# Patient Record
Sex: Female | Born: 1945 | Race: Black or African American | Hispanic: No | Marital: Single | Smoking: Smoker, current status unknown
Health system: Southern US, Community
[De-identification: ages and names within clinical notes are randomized; demographics above are authoritative.]

## PROBLEM LIST (undated history)

## (undated) DIAGNOSIS — E119 Type 2 diabetes mellitus without complications: Secondary | ICD-10-CM

## (undated) DIAGNOSIS — I10 Essential (primary) hypertension: Secondary | ICD-10-CM

## (undated) DIAGNOSIS — F0390 Unspecified dementia without behavioral disturbance: Secondary | ICD-10-CM

---

## 2021-02-07 ENCOUNTER — Encounter (HOSPITAL_COMMUNITY): Payer: Self-pay | Admitting: Pharmacy Technician

## 2021-02-07 ENCOUNTER — Emergency Department (HOSPITAL_COMMUNITY): Payer: Medicare Other

## 2021-02-07 ENCOUNTER — Inpatient Hospital Stay (HOSPITAL_COMMUNITY): Payer: Medicare Other

## 2021-02-07 ENCOUNTER — Inpatient Hospital Stay (HOSPITAL_COMMUNITY)
Admission: EM | Admit: 2021-02-07 | Discharge: 2021-03-17 | DRG: 296 | Disposition: E | Payer: Medicare Other | Attending: Pulmonary Disease | Admitting: Pulmonary Disease

## 2021-02-07 DIAGNOSIS — Z7401 Bed confinement status: Secondary | ICD-10-CM

## 2021-02-07 DIAGNOSIS — S065X9A Traumatic subdural hemorrhage with loss of consciousness of unspecified duration, initial encounter: Secondary | ICD-10-CM

## 2021-02-07 DIAGNOSIS — E876 Hypokalemia: Secondary | ICD-10-CM | POA: Diagnosis present

## 2021-02-07 DIAGNOSIS — G931 Anoxic brain damage, not elsewhere classified: Secondary | ICD-10-CM | POA: Diagnosis present

## 2021-02-07 DIAGNOSIS — E7220 Disorder of urea cycle metabolism, unspecified: Secondary | ICD-10-CM | POA: Diagnosis not present

## 2021-02-07 DIAGNOSIS — Z85118 Personal history of other malignant neoplasm of bronchus and lung: Secondary | ICD-10-CM

## 2021-02-07 DIAGNOSIS — D75839 Thrombocytosis, unspecified: Secondary | ICD-10-CM | POA: Diagnosis not present

## 2021-02-07 DIAGNOSIS — Z20822 Contact with and (suspected) exposure to covid-19: Secondary | ICD-10-CM | POA: Diagnosis present

## 2021-02-07 DIAGNOSIS — I469 Cardiac arrest, cause unspecified: Secondary | ICD-10-CM | POA: Diagnosis present

## 2021-02-07 DIAGNOSIS — E871 Hypo-osmolality and hyponatremia: Secondary | ICD-10-CM | POA: Diagnosis present

## 2021-02-07 DIAGNOSIS — I10 Essential (primary) hypertension: Secondary | ICD-10-CM | POA: Diagnosis present

## 2021-02-07 DIAGNOSIS — E870 Hyperosmolality and hypernatremia: Secondary | ICD-10-CM | POA: Diagnosis not present

## 2021-02-07 DIAGNOSIS — R6520 Severe sepsis without septic shock: Secondary | ICD-10-CM | POA: Diagnosis present

## 2021-02-07 DIAGNOSIS — J9601 Acute respiratory failure with hypoxia: Secondary | ICD-10-CM | POA: Diagnosis present

## 2021-02-07 DIAGNOSIS — A419 Sepsis, unspecified organism: Secondary | ICD-10-CM | POA: Diagnosis present

## 2021-02-07 DIAGNOSIS — E43 Unspecified severe protein-calorie malnutrition: Secondary | ICD-10-CM | POA: Diagnosis present

## 2021-02-07 DIAGNOSIS — G40001 Localization-related (focal) (partial) idiopathic epilepsy and epileptic syndromes with seizures of localized onset, not intractable, with status epilepticus: Secondary | ICD-10-CM | POA: Diagnosis present

## 2021-02-07 DIAGNOSIS — E11649 Type 2 diabetes mellitus with hypoglycemia without coma: Secondary | ICD-10-CM | POA: Diagnosis not present

## 2021-02-07 DIAGNOSIS — J189 Pneumonia, unspecified organism: Secondary | ICD-10-CM

## 2021-02-07 DIAGNOSIS — G40901 Epilepsy, unspecified, not intractable, with status epilepticus: Secondary | ICD-10-CM | POA: Diagnosis not present

## 2021-02-07 DIAGNOSIS — R34 Anuria and oliguria: Secondary | ICD-10-CM | POA: Diagnosis not present

## 2021-02-07 DIAGNOSIS — F0390 Unspecified dementia without behavioral disturbance: Secondary | ICD-10-CM | POA: Diagnosis present

## 2021-02-07 DIAGNOSIS — D509 Iron deficiency anemia, unspecified: Secondary | ICD-10-CM | POA: Diagnosis present

## 2021-02-07 DIAGNOSIS — L98429 Non-pressure chronic ulcer of back with unspecified severity: Secondary | ICD-10-CM | POA: Diagnosis not present

## 2021-02-07 DIAGNOSIS — R64 Cachexia: Secondary | ICD-10-CM | POA: Diagnosis present

## 2021-02-07 DIAGNOSIS — L89892 Pressure ulcer of other site, stage 2: Secondary | ICD-10-CM | POA: Diagnosis present

## 2021-02-07 DIAGNOSIS — R0902 Hypoxemia: Secondary | ICD-10-CM

## 2021-02-07 DIAGNOSIS — Z66 Do not resuscitate: Secondary | ICD-10-CM | POA: Diagnosis present

## 2021-02-07 DIAGNOSIS — R918 Other nonspecific abnormal finding of lung field: Secondary | ICD-10-CM | POA: Diagnosis present

## 2021-02-07 DIAGNOSIS — L8990 Pressure ulcer of unspecified site, unspecified stage: Secondary | ICD-10-CM | POA: Insufficient documentation

## 2021-02-07 DIAGNOSIS — R627 Adult failure to thrive: Secondary | ICD-10-CM | POA: Diagnosis present

## 2021-02-07 DIAGNOSIS — R579 Shock, unspecified: Secondary | ICD-10-CM | POA: Diagnosis not present

## 2021-02-07 DIAGNOSIS — Z978 Presence of other specified devices: Secondary | ICD-10-CM | POA: Diagnosis not present

## 2021-02-07 DIAGNOSIS — I6203 Nontraumatic chronic subdural hemorrhage: Secondary | ICD-10-CM | POA: Diagnosis present

## 2021-02-07 DIAGNOSIS — S065XAA Traumatic subdural hemorrhage with loss of consciousness status unknown, initial encounter: Secondary | ICD-10-CM

## 2021-02-07 DIAGNOSIS — D6489 Other specified anemias: Secondary | ICD-10-CM | POA: Diagnosis present

## 2021-02-07 DIAGNOSIS — I6202 Nontraumatic subacute subdural hemorrhage: Secondary | ICD-10-CM | POA: Diagnosis present

## 2021-02-07 DIAGNOSIS — N179 Acute kidney failure, unspecified: Secondary | ICD-10-CM | POA: Diagnosis present

## 2021-02-07 DIAGNOSIS — L89154 Pressure ulcer of sacral region, stage 4: Secondary | ICD-10-CM | POA: Diagnosis present

## 2021-02-07 DIAGNOSIS — Z681 Body mass index (BMI) 19 or less, adult: Secondary | ICD-10-CM

## 2021-02-07 DIAGNOSIS — I959 Hypotension, unspecified: Secondary | ICD-10-CM

## 2021-02-07 DIAGNOSIS — R001 Bradycardia, unspecified: Secondary | ICD-10-CM | POA: Diagnosis not present

## 2021-02-07 DIAGNOSIS — G40409 Other generalized epilepsy and epileptic syndromes, not intractable, without status epilepticus: Secondary | ICD-10-CM | POA: Diagnosis not present

## 2021-02-07 DIAGNOSIS — Z515 Encounter for palliative care: Secondary | ICD-10-CM | POA: Diagnosis not present

## 2021-02-07 DIAGNOSIS — Z902 Acquired absence of lung [part of]: Secondary | ICD-10-CM

## 2021-02-07 HISTORY — DX: Type 2 diabetes mellitus without complications: E11.9

## 2021-02-07 HISTORY — DX: Unspecified dementia, unspecified severity, without behavioral disturbance, psychotic disturbance, mood disturbance, and anxiety: F03.90

## 2021-02-07 HISTORY — DX: Essential (primary) hypertension: I10

## 2021-02-07 LAB — I-STAT CHEM 8, ED
BUN: 6 mg/dL — ABNORMAL LOW (ref 8–23)
Calcium, Ion: 1.11 mmol/L — ABNORMAL LOW (ref 1.15–1.40)
Chloride: 96 mmol/L — ABNORMAL LOW (ref 98–111)
Creatinine, Ser: 0.8 mg/dL (ref 0.44–1.00)
Glucose, Bld: 185 mg/dL — ABNORMAL HIGH (ref 70–99)
HCT: 25 % — ABNORMAL LOW (ref 36.0–46.0)
Hemoglobin: 8.5 g/dL — ABNORMAL LOW (ref 12.0–15.0)
Potassium: 4.3 mmol/L (ref 3.5–5.1)
Sodium: 130 mmol/L — ABNORMAL LOW (ref 135–145)
TCO2: 22 mmol/L (ref 22–32)

## 2021-02-07 LAB — BASIC METABOLIC PANEL
Anion gap: 15 (ref 5–15)
Anion gap: 6 (ref 5–15)
BUN: 6 mg/dL — ABNORMAL LOW (ref 8–23)
BUN: 8 mg/dL (ref 8–23)
CO2: 17 mmol/L — ABNORMAL LOW (ref 22–32)
CO2: 22 mmol/L (ref 22–32)
Calcium: 7.8 mg/dL — ABNORMAL LOW (ref 8.9–10.3)
Calcium: 6.8 mg/dL — ABNORMAL LOW (ref 8.9–10.3)
Chloride: 98 mmol/L (ref 98–111)
Chloride: 101 mmol/L (ref 98–111)
Creatinine, Ser: 1.15 mg/dL — ABNORMAL HIGH (ref 0.44–1.00)
Creatinine, Ser: 0.89 mg/dL (ref 0.44–1.00)
GFR, Estimated: 50 mL/min — ABNORMAL LOW (ref >60)
GFR, Estimated: >60 mL/min (ref >60)
Glucose, Bld: 183 mg/dL — ABNORMAL HIGH (ref 70–99)
Glucose, Bld: 239 mg/dL — ABNORMAL HIGH (ref 70–99)
Potassium: 4.4 mmol/L (ref 3.5–5.1)
Potassium: 4 mmol/L (ref 3.5–5.1)
Sodium: 130 mmol/L — ABNORMAL LOW (ref 135–145)
Sodium: 129 mmol/L — ABNORMAL LOW (ref 135–145)

## 2021-02-07 LAB — CBC
HCT: 28.9 % — ABNORMAL LOW (ref 36.0–46.0)
Hemoglobin: 8 g/dL — ABNORMAL LOW (ref 12.0–15.0)
MCH: 23 pg — ABNORMAL LOW (ref 26.0–34.0)
MCHC: 27.7 g/dL — ABNORMAL LOW (ref 30.0–36.0)
MCV: 83 fL (ref 80.0–100.0)
Platelets: 515 10*3/uL — ABNORMAL HIGH (ref 150–400)
RBC: 3.48 MIL/uL — ABNORMAL LOW (ref 3.87–5.11)
RDW: 18.7 % — ABNORMAL HIGH (ref 11.5–15.5)
WBC: 15.7 10*3/uL — ABNORMAL HIGH (ref 4.0–10.5)
nRBC: 0 % (ref 0.0–0.2)

## 2021-02-07 LAB — RESP PANEL BY RT-PCR (FLU A&B, COVID) ARPGX2
Influenza A by PCR: NEGATIVE
Influenza B by PCR: NEGATIVE
SARS Coronavirus 2 by RT PCR: NEGATIVE

## 2021-02-07 LAB — I-STAT ARTERIAL BLOOD GAS, ED
Acid-Base Excess: 1 mmol/L (ref 0.0–2.0)
Bicarbonate: 27.4 mmol/L (ref 20.0–28.0)
Calcium, Ion: 1.11 mmol/L — ABNORMAL LOW (ref 1.15–1.40)
HCT: 26 % — ABNORMAL LOW (ref 36.0–46.0)
Hemoglobin: 8.8 g/dL — ABNORMAL LOW (ref 12.0–15.0)
O2 Saturation: 100 %
Potassium: 3.6 mmol/L (ref 3.5–5.1)
Sodium: 132 mmol/L — ABNORMAL LOW (ref 135–145)
TCO2: 29 mmol/L (ref 22–32)
pCO2 arterial: 54.8 mmHg — ABNORMAL HIGH (ref 32.0–48.0)
pH, Arterial: 7.307 — ABNORMAL LOW (ref 7.350–7.450)
pO2, Arterial: 546 mmHg — ABNORMAL HIGH (ref 83.0–108.0)

## 2021-02-07 LAB — LACTIC ACID, PLASMA
Lactic Acid, Venous: 7.9 mmol/L (ref 0.5–1.9)
Lactic Acid, Venous: 3.2 mmol/L (ref 0.5–1.9)

## 2021-02-07 LAB — MRSA PCR SCREENING: MRSA by PCR: NEGATIVE

## 2021-02-07 LAB — GLUCOSE, CAPILLARY
Glucose-Capillary: 143 mg/dL — ABNORMAL HIGH (ref 70–99)
Glucose-Capillary: 152 mg/dL — ABNORMAL HIGH (ref 70–99)
Glucose-Capillary: 142 mg/dL — ABNORMAL HIGH (ref 70–99)

## 2021-02-07 LAB — TROPONIN I (HIGH SENSITIVITY)
Troponin I (High Sensitivity): 19 ng/L — ABNORMAL HIGH (ref <18)
Troponin I (High Sensitivity): 54 ng/L — ABNORMAL HIGH

## 2021-02-07 LAB — AMMONIA: Ammonia: 40 umol/L — ABNORMAL HIGH (ref 9–35)

## 2021-02-07 MED ORDER — NOREPINEPHRINE 4 MG/250ML-% IV SOLN
INTRAVENOUS | Status: AC
  Filled 2021-02-07: qty 250

## 2021-02-07 MED ORDER — LEVETIRACETAM IN NACL 500 MG/100ML IV SOLN: 500.0000 mg | Freq: Two times a day (BID) | INTRAVENOUS | Status: DC

## 2021-02-07 MED ORDER — VANCOMYCIN HCL 1000 MG/200ML IV SOLN: 1000.0000 mg | INTRAVENOUS | Status: DC

## 2021-02-07 MED ORDER — PIPERACILLIN-TAZOBACTAM 3.375 G IVPB
3.3750 g | Freq: Three times a day (TID) | INTRAVENOUS | Status: AC
  Administered 2021-02-08 – 2021-02-13 (×18): 3.375 g via INTRAVENOUS
  Filled 2021-02-07 (×18): qty 50

## 2021-02-07 MED ORDER — ORAL CARE MOUTH RINSE
15.0000 mL | OROMUCOSAL | Status: DC
  Administered 2021-02-07 – 2021-02-24 (×164): 15 mL via OROMUCOSAL

## 2021-02-07 MED ORDER — DOCUSATE SODIUM 100 MG PO CAPS: 100.0000 mg | ORAL_CAPSULE | Freq: Two times a day (BID) | ORAL | Status: DC | PRN

## 2021-02-07 MED ORDER — CHLORHEXIDINE GLUCONATE 0.12% ORAL RINSE (MEDLINE KIT)
15.0000 mL | Freq: Two times a day (BID) | OROMUCOSAL | Status: DC
  Administered 2021-02-07 – 2021-02-24 (×34): 15 mL via OROMUCOSAL

## 2021-02-07 MED ORDER — FENTANYL CITRATE (PF) 100 MCG/2ML IJ SOLN
50.0000 ug | INTRAMUSCULAR | Status: DC | PRN
Start: 2021-02-07 — End: 2021-02-24

## 2021-02-07 MED ORDER — ASPIRIN 300 MG RE SUPP: 300.0000 mg | RECTAL | Status: DC

## 2021-02-07 MED ORDER — METRONIDAZOLE 500 MG/100ML IV SOLN
500.0000 mg | Freq: Three times a day (TID) | INTRAVENOUS | Status: DC
  Administered 2021-02-07: 500 mg via INTRAVENOUS
  Filled 2021-02-07 (×2): qty 100

## 2021-02-07 MED ORDER — LEVETIRACETAM IN NACL 1000 MG/100ML IV SOLN
1000.0000 mg | Freq: Once | INTRAVENOUS | Status: AC
  Administered 2021-02-07: 1000 mg via INTRAVENOUS
  Filled 2021-02-07: qty 100

## 2021-02-07 MED ORDER — ETOMIDATE 2 MG/ML IV SOLN
INTRAVENOUS | Status: AC | PRN
  Administered 2021-02-07: 10 mg via INTRAVENOUS

## 2021-02-07 MED ORDER — SODIUM CHLORIDE 0.9 % IV BOLUS (SEPSIS)
1000.0000 mL | Freq: Once | INTRAVENOUS | Status: AC
  Administered 2021-02-07: 1000 mL via INTRAVENOUS

## 2021-02-07 MED ORDER — LACTATED RINGERS IV SOLN: INTRAVENOUS | Status: AC

## 2021-02-07 MED ORDER — POLYETHYLENE GLYCOL 3350 17 G PO PACK: 17.0000 g | PACK | Freq: Every day | ORAL | Status: DC | PRN

## 2021-02-07 MED ORDER — CHLORHEXIDINE GLUCONATE CLOTH 2 % EX PADS
6.0000 | MEDICATED_PAD | Freq: Every day | CUTANEOUS | Status: DC
  Administered 2021-02-07 – 2021-02-08 (×2): 6 via TOPICAL

## 2021-02-07 MED ORDER — SODIUM CHLORIDE 0.9 % IV SOLN
2.0000 g | Freq: Two times a day (BID) | INTRAVENOUS | Status: DC
  Administered 2021-02-07: 2 g via INTRAVENOUS
  Filled 2021-02-07: qty 2

## 2021-02-07 MED ORDER — VANCOMYCIN HCL 1000 MG/200ML IV SOLN
1000.0000 mg | Freq: Once | INTRAVENOUS | Status: AC
  Administered 2021-02-07: 1000 mg via INTRAVENOUS
  Filled 2021-02-07: qty 200

## 2021-02-07 MED ORDER — GERHARDT'S BUTT CREAM
TOPICAL_CREAM | Freq: Three times a day (TID) | CUTANEOUS | Status: DC
  Administered 2021-02-09 – 2021-02-24 (×8): 1 via TOPICAL
  Filled 2021-02-07 (×2): qty 1

## 2021-02-07 MED ORDER — SODIUM CHLORIDE 0.9 % IV BOLUS: 500.0000 mL | Freq: Once | INTRAVENOUS | Status: DC

## 2021-02-07 MED ORDER — SODIUM CHLORIDE 0.9 % IV SOLN
2.0000 g | Freq: Once | INTRAVENOUS | Status: AC
  Administered 2021-02-07: 2 g via INTRAVENOUS
  Filled 2021-02-07: qty 2

## 2021-02-07 MED ORDER — ROCURONIUM BROMIDE 50 MG/5ML IV SOLN
INTRAVENOUS | Status: AC | PRN
  Administered 2021-02-07: 60 mg via INTRAVENOUS

## 2021-02-07 MED ORDER — FENTANYL CITRATE (PF) 100 MCG/2ML IJ SOLN: 50.0000 ug | INTRAMUSCULAR | Status: DC | PRN

## 2021-02-07 MED ORDER — NOREPINEPHRINE 4 MG/250ML-% IV SOLN
0.0000 ug/min | INTRAVENOUS | Status: DC
  Administered 2021-02-08: 6 ug/min via INTRAVENOUS
  Filled 2021-02-07: qty 250

## 2021-02-07 MED ORDER — VANCOMYCIN HCL 1000 MG/200ML IV SOLN
1000.0000 mg | Freq: Once | INTRAVENOUS | Status: DC
  Filled 2021-02-07: qty 200

## 2021-02-07 MED ORDER — LORAZEPAM 2 MG/ML IJ SOLN
4.0000 mg | Freq: Once | INTRAMUSCULAR | Status: AC
  Administered 2021-02-07: 4 mg via INTRAVENOUS
  Filled 2021-02-07: qty 2

## 2021-02-07 NOTE — ED Notes (Signed)
No corneal reflux at this time.

## 2021-02-07 NOTE — Consult Note (Addendum)
NEUROLOGY CONSULTATION NOTE   Date of service: Feb 07, 2021 Patient Name: Anna Acosta MRN:  [id number] DOB:  11-Jun-1946 Reason for consult: "Myoclonic seizures" Requesting Provider: Glyn Ade MD _ _ _   _ __   _ __ _ _  __ __   _ __   __ _  History of Present Illness  Anna Acosta is a 75 y.o. female with PMH significant for DM2, dementia, HTN, prior lung cancer with an apparent left upper lobe resection, prior subdural hematoma, failure to thrive who had a cardiac arrest with initial rhythm of asystole and 8 mins of CPR before ROSC. King airway in the field and intubated in the ED. Started on low dose epinephrine.  Patient is intubated and unresponsive. She is unable to provide any significant history.  CTH demonstrated mixed density SDH with acute and chronic blood products. This measures up to 20 mm in thickness overlying the parietal lobe. Less than 2 mm right to left midline shift.  She was admitted to the ICU and cEEG was notable for episodes of eye opening and jerking consistent with myoclonic seizures every 10-15seconds as well as profound diffuse encephalopathy.  Neurology consulted for further evaluation. I ordered Ativan 4mg  IV once and Keppra 1000mg  IV load with significant improvement in the EEG.   ROS   Unable to obtain ROS, PMH, PSH, FHx, SHx, allergies as patient is intubated and unresponsive.  Past History   Past Medical History:  Diagnosis Date  . Dementia (HCC)   . Diabetes mellitus without complication (HCC)   . Hypertension     The histories are not reviewed yet. Please review them in the "History" navigator section and refresh this SmartLink. No family history on file. Social History   Socioeconomic History  . Marital status: Single    Spouse name: Not on file  . Number of children: Not on file  . Years of education: Not on file  . Highest education level: Not on file  Occupational History  . Not on file  Tobacco Use  . Smoking  status: Not on file  . Smokeless tobacco: Not on file  Substance and Sexual Activity  . Alcohol use: Not on file  . Drug use: Not on file  . Sexual activity: Not on file  Other Topics Concern  . Not on file  Social History Narrative  . Not on file   Social Determinants of Health   Financial Resource Strain: Not on file  Food Insecurity: Not on file  Transportation Needs: Not on file  Physical Activity: Not on file  Stress: Not on file  Social Connections: Not on file   Not on File  Medications   No medications prior to admission.     Vitals   Vitals:   01/31/2021 2045 01/18/2021 2115 01/16/2021 2130 01/23/2021 2145  BP: (!) 161/129 120/80 130/88 (!) 153/119  Pulse: (!) 109 (!) 110 (!) 105 (!) 108  Resp: 19 19 (!) 22 (!) 22  Temp: (!) 92.66 F (33.7 C) (!) 92.66 F (33.7 C) (!) 92.66 F (33.7 C) (!) 92.48 F (33.6 C)  TempSrc:      SpO2: 91% (!) 88% 94% 94%  Weight:      Height:         Body mass index is 17.93 kg/m.  Physical Exam   General: Laying comfortably in bed; intubated. HENT: Normal oropharynx and mucosa. Normal external appearance of ears and nose. Neck: Supple, no pain or tenderness CV: No JVD.  No peripheral edema. Pulmonary: Symmetric Chest rise. Not breathing over vent. Abdomen: Soft to touch, non-tender. Ext: No cyanosis, LUE pitting edema, no deformity Skin: No rash. Normal palpation of skin.  Musculoskeletal: Normal digits and nails by inspection. No clubbing.  Neurologic Examination(Not on sedation but did get 4mg  of Ativan and 1000mg  of Keppra 10 mins prior to my evaluation)  Mental status/Cognition: Intubated, no response to voice or loud clap. No response to supra-orbital pressure. To nares stimulation, noted to have a jerk of her RUE and face with eye opening.  Brainstem: Pupils 16mm BL and minimally reactive to light Corneals: absent BL. Cough: absent Gag: absent Dolls eyes: negative, eye smove with her head instead of being  stationary. Noted to have spotnaeous opening of her eyes during stimulation, ? Stimulation induced myoclonic jerk.  Motor:  Muscle bulk: poor Tone: increased flexor tone in RUE, increased extensor tone in LUE and BL Lower extremities. Triplex flexion to nailbed pressure in BL big toes. Twitch noted in RUE to proximal pinch.  Reflexes:  Right Left Comments  Pectoralis      Biceps (C5/6) 2 2   Brachioradialis (C5/6) 2 2    Triceps (C6/7) 2 2    Patellar (L3/4) 2 2    Achilles (S1)      Hoffman      Plantar up up   Jaw jerk    Coordination/Complex Motor:  Unable to assess.  Labs   CBC:  Recent Labs  Lab 02/02/2021 1319 02/02/2021 1324 02/09/2021 1506  WBC 15.7*  --   --   HGB 8.0* 8.5* 8.8*  HCT 28.9* 25.0* 26.0*  MCV 83.0  --   --   PLT 515*  --   --     Basic Metabolic Panel:  Lab Results  Component Value Date   NA 129 (L) 01/25/2021   K 4.0 01/18/2021   CO2 22 01/19/2021   GLUCOSE 239 (H) 01/20/2021   BUN 8 02/06/2021   CREATININE 0.89 02/09/2021   CALCIUM 6.8 (L) 01/25/2021   GFRNONAA >60 02/02/2021   Lipid Panel: No results found for: LDLCALC HgbA1c: No results found for: HGBA1C Urine Drug Screen: No results found for: LABOPIA, COCAINSCRNUR, LABBENZ, AMPHETMU, THCU, LABBARB  Alcohol Level No results found for: [facility]  CT Head without contrast: personally reviewed 1. Mixed density right subdural collection with both acute and chronic blood products. This measures up to 20 mm in thickness overlying the parietal lobe. There is mild mass effect on the subjacent right cerebral hemisphere. Less than 2 mm right to left midline shift. 2. Generalized atrophy and chronic small vessel ischemia.  MRI Brain: Pending after cEEG  cEEG:  This studyshowed episodes of eye opening and jerking consistent with myoclonic seizures every 10-15seconds as well as profound diffuse encephalopathy. In setting of cardiac arrest, this is most likely suggestive of anoxic-hypoxic brain  injury.  Impression   Anna Acosta is a 75 y.o. female admitted with cardiac arrest with initial rhythm of asystole and 8 mins of CPR before ROSC. Her neurologic examination about 10 mins after 4mg  of Ativan and Keppra is notable for absent brainstem reflexes, no evidence of higher cerebral function. Did have some stimulation induced myoclonus on exam. cEEG with myoclonic seizures every 10-15seconds as well as profound diffuse encephalopathy, significantly improved after Ativan and Keppra.  Absence of brainstem reflexes and presence of myoclonic seizures after anoxic injury in associated with poor outcomes.  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 - Avoid hyperthermia, hyponatremia, hypovolemia. Keep MAPS above 65. ______________________________________________________________________   This patient is critically ill and at significant risk of neurological worsening, death and care requires constant monitoring of vital signs, hemodynamics,respiratory and cardiac monitoring, neurological assessment, discussion with family, other specialists and medical decision making of high complexity. I spent 50 minutes of neurocritical care time  in the care of  this patient. This was time spent independent of any time provided by nurse practitioner or PA.  [facility] Pager Number 01/16/2021  10:46 PM   Thank you for the opportunity to take part in the care of this patient. If you have any further questions, please contact the neurology consultation attending.  Signed,  Erick Blinks [facility] Pager Number [phone] _ _ _   _ __   _  __ _ _  __ __   _ __   __ _

## 2021-02-07 NOTE — Progress Notes (Signed)
ABG results given to Dr. Myrtis Ser. Verbal order received to increase RR to 18 and wean fio2. RT will continue to monitor and be available as needed.

## 2021-02-07 NOTE — Progress Notes (Signed)
Elink following Code Sepsis. 

## 2021-02-07 NOTE — Progress Notes (Signed)
EEG complete - results pending 

## 2021-02-07 NOTE — Progress Notes (Signed)
RT NOTES: Pt transported from ED Trauma B to room 3M11 on vent without incident.

## 2021-02-07 NOTE — Progress Notes (Signed)
Pharmacy Antibiotic Note  Anna Acosta is a 75 y.o. female admitted on 02/04/2021 with ulcer of the lower back  .  Pharmacy has been consulted for vancomycin and cefepime dosing. Patient presents with witnessed arrest and subsequently intubated. Unclear ulcer history at this time. Spoke with son who indicates he felt it was improving and utilized topical antibiotics at home. Most recent WBC >15, LA 7.9 and Scr 0.80. Currently afebrile.   Plan: Vancomycin 1000 MG load followed by 1000 MG Q24H (AUC 476, Scr 0.80, Wt 60 KG)  Cefepime 2 G Q12H  F/u more accurate weight and clinical progress  Vancomycin levels as indicated  Height: 5\' 4"  (162.6 cm) Weight: 60 kg (132 lb 4.4 oz) IBW/kg (Calculated) : 54.7  Temp (24hrs), Avg:96.6 F (35.9 C), Min:96.6 F (35.9 C), Max:96.6 F (35.9 C)  Recent Labs  Lab 02/12/2021 1319 02/01/2021 1324 01/17/2021 1342  WBC 15.7*  --   --   CREATININE 1.15* 0.80  --   LATICACIDVEN  --   --  7.9*    Estimated Creatinine Clearance: 53.3 mL/min (by C-G formula based on SCr of 0.8 mg/dL).    Not on File  Antimicrobials this admission: 5/24 Vanc >> 5/24 Cefepime >> 5/24 Flagyl >>  Dose adjustments this admission: NA  Microbiology results: 5/24 BCx:  5/24 UCx:    Thank you for allowing pharmacy to be a part of this patient's care.  6/24, PharmD, MBA Pharmacy Resident [phone] 01/24/2021 3:13 PM

## 2021-02-07 NOTE — ED Notes (Signed)
Levophed increased to 

## 2021-02-07 NOTE — Procedures (Addendum)
Patient Name: Anna Acosta  MRN: [id number]  Epilepsy Attending: Charlsie Quest  Referring Physician/Provider: Raymon Mutton, NP Date: 2021/02/23 Duration: 22.43 mins  Patient history: 75yo F s/p cardiac arrest. EEG to evaluate for seizure  Level of alertness:  comatose  AEDs during EEG study: None  Technical aspects: This EEG study was done with scalp electrodes positioned according to the 10-20 International system of electrode placement. Electrical activity was acquired at a sampling rate of 500Hz  and reviewed with a high frequency filter of 70Hz  and a low frequency filter of 1Hz . EEG data were recorded continuously and digitally stored.   Description: Patient was noted to have episodes of spontaneous eye opening and axial jerking ( difficult to see as patient is under sheets) every 15-20seconds. Concomitant eeg showed generalized bursts of polyspikes lasting 0.5-1 second consistent with myoclonic seizure. EEG also showed generalized suppression between bursts lasting 15-20 seconds. EEG was not reactive to tactile stimulation. Hyperventilation and photic stimulation were not performed.     ABNORMALITY - Myoclonic seizure, generalized - Burst suppression, generalzied  IMPRESSION: This study showed episodes of eye opening and jerking consistent with myoclonic seizures every 10-15seconds as well as profound diffuse encephalopathy. In setting of cardiac arrest, this is most likely suggestive of anoxic-hypoxic brain injury.  Dr ( neurohopitalist) was notified.   Anna Acosta 

## 2021-02-07 NOTE — ED Notes (Signed)
edp aware of LA level

## 2021-02-07 NOTE — Progress Notes (Signed)
Pt transported from Trauma B to CT 2 and back on the ventilator. RT and RN accompanied pt. Pt tolerated well, VSS throughout. RT will continue to monitor and be available as needed.

## 2021-02-07 NOTE — Progress Notes (Signed)
LTM EEG hooked up and running - no initial skin breakdown - push button tested - neuro notified. [facility] monitoring. °[facility] monitored, Event button test confirmed by [facility]. ° °

## 2021-02-07 NOTE — ED Triage Notes (Signed)
Pt bib ems from home, son witnessed arrest. CPR intiated by son. CPR from [phone]. Given 1 amp epi along with 500cc NS. 100/65, HR 100.

## 2021-02-07 NOTE — ED Provider Notes (Signed)
[facility] EMERGENCY DEPARTMENT Provider Note   CSN: [id number] Arrival date & time: 01/19/2021  1309     History No chief complaint on file.   Anna Acosta is a 75 y.o. female.  This patient is brought to [place] via EMS post asystole arrest.  1 round of epinephrine was given.  Patient son reports he was cleaning her sacral wound she was face down when he checked on her she was not breathing.  He called paramedics to instruct him to do CPR when they arrived fire department did CPR.  Unknown exact downtime before CPR.  After 1 round of epinephrine return of spontaneous circulation was done EMS twelve-lead shows sinus tachycardia.  Patient does have dementia hypertension and diabetes.  Glucose was in the 190s.        Past Medical History:  Diagnosis Date  . Dementia (HCC)   . Diabetes mellitus without complication (HCC)   . Hypertension     There are no problems to display for this patient.      OB History   No obstetric history on file.     No family history on file.     Home Medications Prior to Admission medications   Not on File    Allergies    Patient has no allergy information on record.  Review of Systems   Review of Systems  Unable to perform ROS: Mental status change    Physical Exam Updated Vital Signs BP 123/69   Pulse 91   Temp (!) 96.6 F (35.9 C) (Temporal)   Resp 15   Ht 5\' 4"  (1.626 m)   Wt 60 kg   SpO2 100%   BMI 22.71 kg/m   Physical Exam Constitutional:      General: She is in acute distress.     Appearance: She is toxic-appearing.  HENT:     Head: Normocephalic and atraumatic.     Nose: No congestion or rhinorrhea.     Mouth/Throat:     Mouth: Mucous membranes are moist.  Eyes:     Comments: No corneal reflex pupils 3 mm minimally reactive to light  Neck:     Comments: Trachea midline King airway in place Cardiovascular:     Rate and Rhythm: Tachycardia present.  Pulmonary:     Comments: Patient  making agonal respirations, rhonchorous breath sounds throughout Abdominal:     General: There is no distension.     Palpations: There is no mass.  Musculoskeletal:     Cervical back: Neck supple.     Comments: Large grade 4 sacral wound approximately 5 cm x 5 cm, significant pitting edema of the left upper extremity pulses intact in all extremities  Skin:    General: Skin is dry.     Findings: No rash.  Neurological:     Comments: GCS 3, flaccid extremities throughout.  No clonus.,  No corneal reflex, no gag reflex     ED Results / Procedures / Treatments   Labs (all labs ordered are listed, but only abnormal results are displayed) Labs Reviewed  BASIC METABOLIC PANEL - Abnormal; Notable for the following components:      Result Value   Sodium 130 (*)    CO2 17 (*)    Glucose, Bld 183 (*)    BUN 6 (*)    Creatinine, Ser 1.15 (*)    Calcium 7.8 (*)    GFR, Estimated 50 (*)    All other components within normal limits  CBC - Abnormal; Notable for the following components:   WBC 15.7 (*)    RBC 3.48 (*)    Hemoglobin 8.0 (*)    HCT 28.9 (*)    MCH 23.0 (*)    MCHC 27.7 (*)    RDW 18.7 (*)    Platelets 515 (*)    All other components within normal limits  LACTIC ACID, PLASMA - Abnormal; Notable for the following components:   Lactic Acid, Venous 7.9 (*)    All other components within normal limits  I-STAT CHEM 8, ED - Abnormal; Notable for the following components:   Sodium 130 (*)    Chloride 96 (*)    BUN 6 (*)    Glucose, Bld 185 (*)    Calcium, Ion 1.11 (*)    Hemoglobin 8.5 (*)    HCT 25.0 (*)    All other components within normal limits  I-STAT ARTERIAL BLOOD GAS, ED - Abnormal; Notable for the following components:   pH, Arterial 7.307 (*)    pCO2 arterial 54.8 (*)    pO2, Arterial 546 (*)    Sodium 132 (*)    Calcium, Ion 1.11 (*)    HCT 26.0 (*)    Hemoglobin 8.8 (*)    All other components within normal limits  TROPONIN I (HIGH SENSITIVITY) -  Abnormal; Notable for the following components:   Troponin I (High Sensitivity) 19 (*)    All other components within normal limits  RESP PANEL BY RT-PCR (FLU A&B, COVID) ARPGX2  CULTURE, BLOOD (ROUTINE X 2)  CULTURE, BLOOD (ROUTINE X 2)  URINE CULTURE  LACTIC ACID, PLASMA  PROTIME-INR  APTT  URINALYSIS, ROUTINE W REFLEX MICROSCOPIC  BLOOD GAS, ARTERIAL  TROPONIN I (HIGH SENSITIVITY)    EKG None  Radiology CT Head Wo Contrast  Result Date: 02/10/2021 CLINICAL DATA:  Altered mental status.  Post cardiac arrest. EXAM: CT HEAD WITHOUT CONTRAST TECHNIQUE: Contiguous axial images were obtained from the base of the skull through the vertex without intravenous contrast. COMPARISON:  None. FINDINGS: Brain: Mixed density right subdural collection with both acute and chronic blood products. This measures up to 20 mm in thickness overlying the parietal lobe, series 4, image 20. There is mild mass effect on the subjacent right cerebral hemisphere. Less than 2 mm right to left midline shift. Generalized atrophy, with a slight temporal lobe predominant. Ventriculomegaly may be due to central atrophy. Periventricular white matter hypodensity typical of chronic small vessel ischemia. Small remote lacunar infarct in the left basal ganglia. No evidence of acute ischemia. Partially empty sella. The basilar cisterns are patent. Vascular: No hyperdense vessel. Skull: No fracture or focal lesion. Sinuses/Orbits: Mucosal thickening throughout the ethmoid air cells with small fluid levels in the sphenoid sinuses. Opacification of right greater than left mastoid air cells. Mild proptosis. No acute orbital findings. Other: None. IMPRESSION: 1. Mixed density right subdural collection with both acute and chronic blood products. This measures up to 20 mm in thickness overlying the parietal lobe. There is mild mass effect on the subjacent right cerebral hemisphere. Less than 2 mm right to left midline shift. 2. Generalized  atrophy and chronic small vessel ischemia. Critical Value/emergent results were called by telephone at the time of interpretation on 02/11/2021 at 3:34 pm to provider Anagha Loseke , who verbally acknowledged these results. Electronically Signed   By: Narda RutherfordMelanie  Sanford M.D.   On: 02/05/2021 15:35   DG Chest Portable 1 View  Addendum Date: 01/26/2021   ADDENDUM  REPORT: 01/29/2021 14:32 ADDENDUM: These results were called by telephone at the time of physician contact on 01/30/2021 at 2:32 pm to provider Keino Placencia , who verbally acknowledged these results. Electronically Signed   By: Kreg Shropshire M.D.   On: 02/10/2021 14:32   Result Date: 02/05/2021 CLINICAL DATA:  ETT placement EXAM: PORTABLE CHEST 1 VIEW COMPARISON:  None. FINDINGS: Endotracheal tube tip terminates 2 cm from the carina. Transesophageal tube tip and side port are distal to the GE junction, terminating in the left upper quadrant. Surgical clips project along the left mediastinal margin. Diffusely coarsened interstitial opacities are present throughout the lungs. More solid masslike left apical density is present with some associated volume loss and tenting of the left hemidiaphragm. Additional bandlike opacities present in the right lung apex may reflect scarring. Portion of the superior left mediastinal margin obscured by the left apical masslike opacity. Remaining cardiomediastinal contours are unremarkable. No pneumothorax. Degenerative changes are present in the imaged spine and shoulders. IMPRESSION: Masslike opacity in the left lung apex. In the absence of comparison imaging, consider CT evaluation. Lines and tubes as above. Could consider retraction of the endotracheal tube 1 cm to position in the mid trachea. Electronically Signed: By: Kreg Shropshire M.D. On: 01/21/2021 14:28    Procedures Procedure Name: Intubation Date/Time: 01/29/2021 3:50 PM Performed by: Sabino Donovan, MD Pre-anesthesia Checklist: Patient identified, Patient being  monitored, Emergency Drugs available, Timeout performed and Suction available Oxygen Delivery Method: Ambu bag Preoxygenation: Pre-oxygenation with 100% oxygen Induction Type: Rapid sequence Ventilation: Mask ventilation without difficulty Laryngoscope Size: Glidescope and 4 Grade View: Grade II Tube size: 7.5 mm Number of attempts: 1 Airway Equipment and Method: Stylet Placement Confirmation: ETT inserted through vocal cords under direct vision,  CO2 detector and Breath sounds checked- equal and bilateral Secured at: 22 cm Tube secured with: ETT holder    .Critical Care E&M Performed by: Sabino Donovan, MD  Critical care provider statement:    Critical care was necessary to treat or prevent imminent or life-threatening deterioration of the following conditions:  Cardiac failure, circulatory failure, CNS failure or compromise, respiratory failure and shock   Critical care was time spent personally by me on the following activities:  Blood draw for specimens, development of treatment plan with patient or surrogate, discussions with consultants, evaluation of patient's response to treatment, examination of patient, obtaining history from patient or surrogate, ordering and performing treatments and interventions, ordering and review of laboratory studies, re-evaluation of patient's condition, review of old charts, pulse oximetry, ordering and review of radiographic studies, gastric intubation and ventilator management   Care discussed with: admitting provider   After initial E/M assessment, critical care services were subsequently performed that were exclusive of separately billable procedures or treatment.       Medications Ordered in ED Medications  vancomycin (VANCOREADY) IVPB 1000 mg/200 mL (has no administration in time range)  metroNIDAZOLE (FLAGYL) IVPB 500 mg (has no administration in time range)  fentaNYL (SUBLIMAZE) injection 50 mcg (has no administration in time range)   fentaNYL (SUBLIMAZE) injection 50 mcg (has no administration in time range)  lactated ringers infusion (has no administration in time range)  norepinephrine (LEVOPHED) 4-5 MG/250ML-% infusion SOLN (has no administration in time range)  ceFEPIme (MAXIPIME) 2 g in sodium chloride 0.9 % 100 mL IVPB (has no administration in time range)  vancomycin (VANCOREADY) IVPB 1000 mg/200 mL (has no administration in time range)  etomidate (AMIDATE) injection (10 mg Intravenous Given 02/03/2021  1327)  rocuronium (ZEMURON) injection (60 mg Intravenous Given 01/27/2021 1329)  ceFEPIme (MAXIPIME) 2 g in sodium chloride 0.9 % 100 mL IVPB (2 g Intravenous New Bag/Given 01/21/2021 1434)  sodium chloride 0.9 % bolus 1,000 mL (1,000 mLs Intravenous New Bag/Given 01/20/2021 1418)    ED Course  I have reviewed the triage vital signs and the nursing notes.  Pertinent labs & imaging results that were available during my care of the patient were reviewed by me and considered in my medical decision making (see chart for details).    MDM Rules/Calculators/A&P                         Post asystole arrest.  No neurologic function noted on exam, ventilated, intubated.  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  Patient treated with broad-spectrum antibiotics for sepsis secondary to possible sacral ulcer.  Other screening laboratory studies sent.  Had long conversation with the patient's son about patient's prognosis not being good given her lack of neurologic function right now.  He seems to not understand this and seems to indicate he is confident she will recover.  She just needs some rest and some nutrition.  Chaplain is at bedside to talk with patient as well.  Critical care is consulted for admission.  I fear that the patient may have had a hypoxic event secondary to being face down for prolonged period of time however other events may be possible may be related to the subdural.  Patient seems to be a poor  surgical candidate at this time.  We will let him critical care medicine consult with patient and family for need of consulted intervention.  CRITICAL CARE Performed by: Sabino Donovan   Total critical care time: 60 minutes  Critical care time was exclusive of separately billable procedures and treating other patients.  Critical care was necessary to treat or prevent imminent or life-threatening deterioration.  Critical care was time spent personally by me on the following activities: development of treatment plan with patient and/or surrogate as well as nursing, discussions with consultants, evaluation of patient's response to treatment, examination of patient, obtaining history from patient or surrogate, ordering and performing treatments and interventions, ordering and review of laboratory studies, ordering and review of radiographic studies, pulse oximetry and re-evaluation of patient's condition.    Final Clinical Impression(s) / ED Diagnoses Final diagnoses:  Asystole (HCC)  SDH (subdural hematoma) (HCC)  Hypotension, unspecified hypotension type  Skin ulcer of sacrum, unspecified ulcer stage [facility])    Rx / DC Orders ED Discharge Orders    None       Sabino Donovan, MD 01/27/2021 1554

## 2021-02-07 NOTE — Progress Notes (Signed)
Responded to page to support patient and son at bedside. Pt came to ED after experiencing a CPR.  Per EDP patient is critical but recovering. EDP spoke with son on patients condition. Possibly going to ICU.  Here are other children in [place].  Pt. Son have not have not notified others due to family dynamics.  Provided emotional and spiritual support to son and patient.  Will follow as needed.  Venida Jarvis, [place], [facility], Pager [phone]

## 2021-02-07 NOTE — H&P (Signed)
NAME:  Anna Acosta MRN:  [id number] DOB:  November 04, 1945 LOS: 0 ADMISSION DATE:  2021/02/11 CONSULTATION DATE:  02/11/2021 REFERRING MD:  Myrtis Ser CHIEF COMPLAINT:  Cardiac arrest   History of Present Illness:  75 year old chronically unwell female with PMHx significant for HTN, T2DM, lung cancer (s/p resection), dementia and FTT who presented to [facility] ED 5/24 via EMS s/p cardiac arrest. Patient's son was reportedly caring for her sacral decubitus ulcer when he turned her back over and realized she was not breathing/pulseless. Patient's son immediately started CPR. Initial rhythm on first responder arrival was asystole with completion of CPR x 2 rounds (8 total minutes) and Epi x 1 with ROSC. King airway was placed in the field, exchanged to ETT. Low-dose Levophed was initiated.  On arrival to ED, labs notable for WBC 15K, H&H 8.0/28.9, Plt 515. Na 130, K 4.4, CO2 17, BUN 6, Cr 1.15. Trop 19, LA 7.9. ABG 7.370/54.8/546/27.4. CXR demonstrating masslike opacity in L lung apex. CT Head demonstrating mixed density R subdural collection (acute/chronic) with mild mass effect, slight < 38mm R to L midline shift.  PCCM consulted for admission.  Pertinent Medical History:  HTN, T2DM, dementia, lung CA (s/p resection), FTT  Significant Hospital Events: Including procedures, antibiotic start and stop dates in addition to other pertinent events   . 5/24 - BIB EMS s/p cardiac arrest; CPR x 2 rounds (8 total minutes) and Epi x 1 with ROSC. CT Head with mixed acute/chronic subdural collection with mass effect/slight midline shift. Elevated LA. On low dose pressors.  Interim History / Subjective:    Objective:  Blood pressure 123/69, pulse 91, temperature (!) 96.6 F (35.9 C), temperature source Temporal, resp. rate 15, height 5\' 4"  (1.626 m), weight 60 kg, SpO2 100 %.    Vent Mode: PRVC FiO2 (%):  [100 %] 100 % Set Rate:  [16 bmp] 16 bmp Vt Set:  [430 mL] 430 mL PEEP:  [5 cmH20] 5 cmH20  No intake or  output data in the 24 hours ending 2021/02/11 1513 Filed Weights   11-Feb-2021 1500  Weight: 60 kg   Physical Examination: General: Chronically ill-appearing woman in NAD. Cachectic. HEENT: [place]/AT, anicteric sclera, pinpoint, nonreactive pupils, dry moist mucous membranes. Neuro: [name]. Does not respond to verbal, tactile or noxious stimuli. Not following commands. No corneal, cough or gag. CV: RRR, no m/g/r. PULM: Breathing even and unlabored on vent (PEEP 5, FiO2 40%). Lung fields coarse bilaterally. GI: Soft, nontender, nondistended. Normoactive bowel sounds. Extremities: Trace BLE edema noted, R > L. Posterior RLE thigh wound with pink base. Skin: Warm/dry, no significant rashes. Significant sacral decubitus ulcer with pink granulation tissue, slight yellow granulation tissue, no eschar noted. Tunneling noted from 10 o'clock to 1 o'clock positions. (See media tab for photo)  Labs/imaging that I have personally reviewed: (right click and "Reselect all SmartList Selections" daily)  WBC 15K, H&H 8.0/28.9, Plt 515  Na 130, K 4.4, CO2 17, BUN 6, Cr 1.15  Trop 19, LA 7.9 ABG 7.370/54.8/546/27.4  CXR demonstrating masslike opacity in L lung apex   CT Head demonstrating mixed density R subdural collection (acute/chronic) with mild mass effect, slight < 67mm R to L midline shift.  Resolved Hospital Problem List:     Assessment & Plan:  Cardiac arrest -Unknown etiology but concern for sepsis  P: Normothermia protocol  Continued assessment of hemodynamic support, if pressors needed will place CVC and A-line Obtain  Echo  Trend troponin and lactic acid   Severe sepsis -  Patient presented with temp 96.9, heart rate 104, WBC 15.7, and lactic acid of 7.9 meeting criteria for severe sepsis  P: Admit to ICU Supplemental oxygen via vent as below  Pan cultures prior to antibiotic Broad spectrum IV antibiotics  Ensure adequate IV hydration per sepsis protocol  MAP goal < 65  Trend lactic  acid Monitor urine output  Acute hypoxic respiratory failure  -In the setting of cardiac arrest and sepsis  P: Continue ventilator support with lung protective strategies  Wean PEEP and FiO2 for sats greater than 90%. Head of bed elevated 30 degrees. Plateau pressures less than 30 cm H20.  Follow intermittent chest x-ray and ABG.   Ensure adequate pulmonary hygiene  Follow cultures  VAP bundle in place  PAD protocol  At risk for anoxic encephalopathy Acute metabolic encephalopathy  -Given poor neuro exam concern for possible anoxic injury Right subdural hematoma  -Per patient's son, known prior to admission. No history of trauma or recent falls. CT demonstrating mixed density blood products (acute-on-chronic). No history of blood-thinning medications. P: Minimize sedation  Consider EEG Maintain neuro protective measures; goal for normothermia, euglycemia, eunatremia, normoxia, and PCO2 goal of 35-40 Nutrition and bowel regiment  Seizure precautions  Aspirations precautions  LUE swelling, unilateral -Present for several weeks per son P: -Obtain UE/LE dopplers  Sacral decubitus ulcer -Present for several months prior to admission, patient's son has been caring for this at home on his own. P: - WOC consult - Consider General Surgery consult  History of hypertension History of diabetes -Per patient's son, not on any antihypertensives or glucose control medications at home P: - CBGs  History of lung cancer s/p resection - Unclear timeline, history obtained from son   Best Practice: (right click and "Reselect all SmartList Selections" daily)  Diet:  NPO Pain/Anxiety/Delirium protocol (if indicated): Yes (RASS goal 0) VAP protocol (if indicated): Yes DVT prophylaxis: Contraindicated GI prophylaxis: PPI Glucose control:  SSI No Central venous access:  N/A Arterial line:  N/A Foley:  Yes, and it is still needed Mobility:  bed rest  PT consulted: N/A Last date  of multidisciplinary goals of care discussion [5/24] Code Status:  full code Disposition: ICU  Labs:   CBC: Recent Labs  Lab 2021-02-20 1319 02/20/21 1324 Feb 20, 2021 1506  WBC 15.7*  --   --   HGB 8.0* 8.5* 8.8*  HCT 28.9* 25.0* 26.0*  MCV 83.0  --   --   PLT 515*  --   --     Basic Metabolic Panel: Recent Labs  Lab 20-Feb-2021 1319 2021/02/20 1324 02-20-2021 1506  NA 130* 130* 132*  K 4.4 4.3 3.6  CL 98 96*  --   CO2 17*  --   --   GLUCOSE 183* 185*  --   BUN 6* 6*  --   CREATININE 1.15* 0.80  --   CALCIUM 7.8*  --   --    GFR: Estimated Creatinine Clearance: 53.3 mL/min (by C-G formula based on SCr of 0.8 mg/dL). Recent Labs  Lab 02/20/21 1319 02-20-21 1342  WBC 15.7*  --   LATICACIDVEN  --  7.9*    Liver Function Tests: No results for input(s): AST, ALT, ALKPHOS, BILITOT, PROT, ALBUMIN in the last 168 hours. No results for input(s): LIPASE, AMYLASE in the last 168 hours. No results for input(s): AMMONIA in the last 168 hours.  ABG:    Component Value Date/Time   PHART 7.307 (L) 02/20/2021 1506   PCO2ART 54.8 (H)  March 07, 2021 1506   PO2ART 546 (H) 03-07-21 1506   HCO3 27.4 03-07-21 1506   TCO2 29 07-Mar-2021 1506   O2SAT 100.0 07-Mar-2021 1506     Coagulation Profile: No results for input(s): INR, PROTIME in the last 168 hours.  Cardiac Enzymes: No results for input(s): CKTOTAL, CKMB, CKMBINDEX, TROPONINI in the last 168 hours.  HbA1C: No results found for: HGBA1C  CBG: No results for input(s): GLUCAP in the last 168 hours.  Review of Systems:   Patient is encephalopathic and/or intubated. Therefore history has been obtained from chart review.   Past Medical History:  She,  has a past medical history of Dementia (HCC), Diabetes mellitus without complication (HCC), and Hypertension.   Surgical History:  L lung resection  Social History:      Family History:  Her family history is not on file.   Allergies Not on File   Home Medications   Prior to Admission medications   Not on File    Critical care time: 51 minutes   Tim Lair, [place] [facility] 2021-03-07 3:13 PM  Please see [email or web address] for pager details.  From 7A-7P if no response, please call [phone] After hours, please call E-Link [phone]

## 2021-02-07 NOTE — Progress Notes (Signed)
Pt arrived being bagged by EMS. Pt with king airway in place. King airway removed by Dr. Myrtis Ser and 7.5 ETT placed 23cm at the lip. Pt with positive color change and bilateral breath sounds, cxr pending. Pt then placed on ventilator. RT to obtain abg in 1hour. RT will continue to monitor and be available as needed.

## 2021-02-07 NOTE — ED Notes (Signed)
Levophed started at 5mcg 

## 2021-02-08 ENCOUNTER — Inpatient Hospital Stay (HOSPITAL_COMMUNITY): Payer: Medicare Other

## 2021-02-08 ENCOUNTER — Encounter (HOSPITAL_COMMUNITY): Payer: Self-pay | Admitting: Emergency Medicine

## 2021-02-08 ENCOUNTER — Other Ambulatory Visit: Payer: Self-pay

## 2021-02-08 DIAGNOSIS — G40409 Other generalized epilepsy and epileptic syndromes, not intractable, without status epilepticus: Secondary | ICD-10-CM

## 2021-02-08 DIAGNOSIS — R609 Edema, unspecified: Secondary | ICD-10-CM

## 2021-02-08 DIAGNOSIS — L8990 Pressure ulcer of unspecified site, unspecified stage: Secondary | ICD-10-CM | POA: Insufficient documentation

## 2021-02-08 DIAGNOSIS — I469 Cardiac arrest, cause unspecified: Secondary | ICD-10-CM

## 2021-02-08 DIAGNOSIS — M7989 Other specified soft tissue disorders: Secondary | ICD-10-CM

## 2021-02-08 DIAGNOSIS — I959 Hypotension, unspecified: Secondary | ICD-10-CM

## 2021-02-08 LAB — BASIC METABOLIC PANEL
Anion gap: 15 (ref 5–15)
BUN: 17 mg/dL (ref 8–23)
CO2: 21 mmol/L — ABNORMAL LOW (ref 22–32)
Calcium: 7.8 mg/dL — ABNORMAL LOW (ref 8.9–10.3)
Chloride: 98 mmol/L (ref 98–111)
Creatinine, Ser: 1.31 mg/dL — ABNORMAL HIGH (ref 0.44–1.00)
GFR, Estimated: 43 mL/min — ABNORMAL LOW (ref >60)
Glucose, Bld: 131 mg/dL — ABNORMAL HIGH (ref 70–99)
Potassium: 5 mmol/L (ref 3.5–5.1)
Sodium: 134 mmol/L — ABNORMAL LOW (ref 135–145)

## 2021-02-08 LAB — CBC
HCT: 32.3 % — ABNORMAL LOW (ref 36.0–46.0)
Hemoglobin: 9.6 g/dL — ABNORMAL LOW (ref 12.0–15.0)
MCH: 23.6 pg — ABNORMAL LOW (ref 26.0–34.0)
MCHC: 29.7 g/dL — ABNORMAL LOW (ref 30.0–36.0)
MCV: 79.4 fL — ABNORMAL LOW (ref 80.0–100.0)
Platelets: 521 10*3/uL — ABNORMAL HIGH (ref 150–400)
RBC: 4.07 MIL/uL (ref 3.87–5.11)
RDW: 18.2 % — ABNORMAL HIGH (ref 11.5–15.5)
WBC: 33.3 10*3/uL — ABNORMAL HIGH (ref 4.0–10.5)
nRBC: 0 % (ref 0.0–0.2)

## 2021-02-08 LAB — ECHOCARDIOGRAM COMPLETE
AR max vel: 1.43 cm2
AV Area VTI: 1.61 cm2
AV Area mean vel: 1.16 cm2
AV Mean grad: 16 mmHg
AV Peak grad: 24.6 mmHg
Ao pk vel: 2.48 m/s
Area-P 1/2: 5.02 cm2
Height: 64.016 in
S' Lateral: 1.7 cm
Weight: 1643.75 oz

## 2021-02-08 LAB — GLUCOSE, CAPILLARY
Glucose-Capillary: 119 mg/dL — ABNORMAL HIGH (ref 70–99)
Glucose-Capillary: 119 mg/dL — ABNORMAL HIGH (ref 70–99)
Glucose-Capillary: 122 mg/dL — ABNORMAL HIGH (ref 70–99)
Glucose-Capillary: 124 mg/dL — ABNORMAL HIGH (ref 70–99)
Glucose-Capillary: 130 mg/dL — ABNORMAL HIGH (ref 70–99)
Glucose-Capillary: 131 mg/dL — ABNORMAL HIGH (ref 70–99)

## 2021-02-08 LAB — PHOSPHORUS: Phosphorus: 5.4 mg/dL — ABNORMAL HIGH (ref 2.5–4.6)

## 2021-02-08 LAB — MAGNESIUM: Magnesium: 1.8 mg/dL (ref 1.7–2.4)

## 2021-02-08 MED ORDER — PROPOFOL 1000 MG/100ML IV EMUL
0.0000 ug/kg/min | INTRAVENOUS | Status: DC
  Administered 2021-02-08: 5 ug/kg/min via INTRAVENOUS
  Administered 2021-02-09 (×2): 20 ug/kg/min via INTRAVENOUS
  Administered 2021-02-10 (×2): 10 ug/kg/min via INTRAVENOUS
  Administered 2021-02-11 – 2021-02-12 (×3): 40 ug/kg/min via INTRAVENOUS
  Administered 2021-02-13: 20 ug/kg/min via INTRAVENOUS
  Administered 2021-02-13: 30 ug/kg/min via INTRAVENOUS
  Administered 2021-02-14: 20 ug/kg/min via INTRAVENOUS
  Administered 2021-02-14: 30 ug/kg/min via INTRAVENOUS
  Administered 2021-02-14 – 2021-02-18 (×12): 35 ug/kg/min via INTRAVENOUS
  Administered 2021-02-19 – 2021-02-22 (×10): 40 ug/kg/min via INTRAVENOUS
  Administered 2021-02-22: 35 ug/kg/min via INTRAVENOUS
  Administered 2021-02-22: 40 ug/kg/min via INTRAVENOUS
  Administered 2021-02-23: 20 ug/kg/min via INTRAVENOUS
  Administered 2021-02-24: 15 ug/kg/min via INTRAVENOUS
  Filled 2021-02-08 (×39): qty 100

## 2021-02-08 MED ORDER — SODIUM CHLORIDE 0.9 % IV SOLN
250.0000 mL | INTRAVENOUS | Status: DC
  Administered 2021-02-23: 250 mL via INTRAVENOUS

## 2021-02-08 MED ORDER — LEVETIRACETAM IN NACL 1000 MG/100ML IV SOLN
1000.0000 mg | Freq: Two times a day (BID) | INTRAVENOUS | Status: DC
  Administered 2021-02-08 – 2021-02-17 (×20): 1000 mg via INTRAVENOUS
  Filled 2021-02-08 (×21): qty 100

## 2021-02-08 MED ORDER — LORAZEPAM 2 MG/ML IJ SOLN
2.0000 mg | Freq: Once | INTRAMUSCULAR | Status: AC
  Administered 2021-02-08: 2 mg via INTRAVENOUS
  Filled 2021-02-08: qty 1

## 2021-02-08 MED ORDER — VALPROATE SODIUM 100 MG/ML IV SOLN
250.0000 mg | Freq: Two times a day (BID) | INTRAVENOUS | Status: DC
  Administered 2021-02-08 – 2021-02-17 (×20): 250 mg via INTRAVENOUS
  Filled 2021-02-08 (×23): qty 2.5

## 2021-02-08 MED ORDER — POLYETHYLENE GLYCOL 3350 17 G PO PACK: 17.0000 g | PACK | Freq: Every day | ORAL | Status: DC | PRN

## 2021-02-08 MED ORDER — VALPROATE SODIUM 100 MG/ML IV SOLN
500.0000 mg | Freq: Once | INTRAVENOUS | Status: AC
  Administered 2021-02-08: 500 mg via INTRAVENOUS
  Filled 2021-02-08: qty 5

## 2021-02-08 MED ORDER — LACTATED RINGERS IV SOLN: INTRAVENOUS | Status: AC

## 2021-02-08 MED ORDER — LEVETIRACETAM IN NACL 500 MG/100ML IV SOLN
500.0000 mg | Freq: Once | INTRAVENOUS | Status: AC
  Administered 2021-02-08: 500 mg via INTRAVENOUS
  Filled 2021-02-08: qty 100

## 2021-02-08 MED ORDER — PANTOPRAZOLE SODIUM 40 MG PO PACK
40.0000 mg | PACK | Freq: Every day | ORAL | Status: DC
  Administered 2021-02-08 – 2021-02-24 (×17): 40 mg
  Filled 2021-02-08 (×17): qty 20

## 2021-02-08 MED ORDER — LACTATED RINGERS IV BOLUS
1000.0000 mL | Freq: Once | INTRAVENOUS | Status: AC
  Administered 2021-02-08: 1000 mL via INTRAVENOUS

## 2021-02-08 MED ORDER — CHLORHEXIDINE GLUCONATE CLOTH 2 % EX PADS
6.0000 | MEDICATED_PAD | Freq: Every day | CUTANEOUS | Status: DC
  Administered 2021-02-08 – 2021-02-24 (×16): 6 via TOPICAL

## 2021-02-08 MED ORDER — VALPROATE SODIUM 100 MG/ML IV SOLN
1500.0000 mg | INTRAVENOUS | Status: AC
  Administered 2021-02-08: 1500 mg via INTRAVENOUS
  Filled 2021-02-08: qty 15

## 2021-02-08 MED ORDER — DOCUSATE SODIUM 50 MG/5ML PO LIQD: 100.0000 mg | Freq: Two times a day (BID) | ORAL | Status: DC | PRN

## 2021-02-08 MED ORDER — NOREPINEPHRINE 4 MG/250ML-% IV SOLN
2.0000 ug/min | INTRAVENOUS | Status: DC
  Administered 2021-02-08 (×2): 10 ug/min via INTRAVENOUS
  Administered 2021-02-08: 12 ug/min via INTRAVENOUS
  Administered 2021-02-09: 4 ug/min via INTRAVENOUS
  Administered 2021-02-09: 22 ug/min via INTRAVENOUS
  Administered 2021-02-09: 12 ug/min via INTRAVENOUS
  Administered 2021-02-09: 11 ug/min via INTRAVENOUS
  Administered 2021-02-09: 24 ug/min via INTRAVENOUS
  Administered 2021-02-10 (×2): 25 ug/min via INTRAVENOUS
  Administered 2021-02-10: 15 ug/min via INTRAVENOUS
  Administered 2021-02-10: 25 ug/min via INTRAVENOUS
  Filled 2021-02-08 (×14): qty 250

## 2021-02-08 MED ORDER — PROPOFOL 1000 MG/100ML IV EMUL: 5.0000 ug/kg/min | INTRAVENOUS | Status: DC

## 2021-02-08 NOTE — Progress Notes (Signed)
Spoke w/ Gretchin, RN via Elink regarding BP declining although Levophed is being titrated to maintain a stable bp.Currently Levophed is at 24 mcg/min and titrated Propofol off due to not helping w/ monoclonic activity and effect on BP. BP at this time is 86/63 map 68. Continuing to monitor at bedside. Will notify Gretchin, RN if any changes.

## 2021-02-08 NOTE — Progress Notes (Signed)
  Echocardiogram 2D Echocardiogram has been performed.  Anna Acosta 02/08/2021, 10:54 AM

## 2021-02-08 NOTE — Progress Notes (Signed)
maint complete.  

## 2021-02-08 NOTE — Progress Notes (Signed)
NAME:  Anna Acosta MRN:  [id number] DOB:  1946-05-21 LOS: 1 ADMISSION DATE:  02/12/2021 CONSULTATION DATE:  01/18/2021 REFERRING MD:  Myrtis Ser CHIEF COMPLAINT:  Cardiac arrest   History of Present Illness:  75 year old chronically unwell female with PMHx significant for HTN, T2DM, lung cancer (s/p resection), dementia and FTT who presented to [facility] ED 5/24 via EMS s/p cardiac arrest. Patient's son was reportedly caring for her sacral decubitus ulcer when he turned her back over and realized she was not breathing/pulseless. Patient's son immediately started CPR. Initial rhythm on first responder arrival was asystole with completion of CPR x 2 rounds (8 total minutes) and Epi x 1 with ROSC. King airway was placed in the field, exchanged to ETT. Low-dose Levophed was initiated.  On arrival to ED, labs notable for WBC 15K, H&H 8.0/28.9, Plt 515. Na 130, K 4.4, CO2 17, BUN 6, Cr 1.15. Trop 19, LA 7.9. ABG 7.370/54.8/546/27.4. CXR demonstrating masslike opacity in L lung apex. CT Head demonstrating mixed density R subdural collection (acute/chronic) with mild mass effect, slight < 10mm R to L midline shift.  PCCM consulted for admission.  Pertinent Medical History:  HTN, T2DM, dementia, lung CA (s/p resection), FTT  Significant Hospital Events: Including procedures, antibiotic start and stop dates in addition to other pertinent events   . 5/24 - BIB EMS s/p cardiac arrest; CPR x 2 rounds (8 total minutes) and Epi x 1 with ROSC. CT Head with mixed acute/chronic subdural collection with mass effect/slight midline shift. Elevated LA. On low dose pressors.  Interim History / Subjective:   Overnight, EEG showed myoclonic seizures even after Keppra and ativan given. Valproic acid added. She continues to have myoclonic activity this morning. Intubated on pressor support.   Objective:  Blood pressure (!) 123/51, pulse 90, temperature (!) 95.18 F (35.1 C), resp. rate (!) 28, height 5' 4.02" (1.626 m),  weight 46.6 kg, SpO2 99 %.    Vent Mode: PRVC FiO2 (%):  [40 %-100 %] 40 % Set Rate:  [16 bmp-18 bmp] 18 bmp Vt Set:  [430 mL] 430 mL PEEP:  [5 cmH20] 5 cmH20 Plateau Pressure:  [16 cmH20-22 cmH20] 22 cmH20   Intake/Output Summary (Last 24 hours) at 02/08/2021 0741 Last data filed at 02/08/2021 0700 Gross per 24 hour  Intake 868 ml  Output 170 ml  Net 698 ml   Filed Weights   01/17/2021 1500 01/17/2021 1820 02/08/21 0403  Weight: 60 kg 47.4 kg 46.6 kg   Physical Examination: General: Chronically ill-appearing woman in NAD. Cachectic. Intubated, actively seizing  HEENT: [place]/AT, dry moist mucous membranes Neuro: [name].  Not following commands. Actively seizing CV: RRR, no m/g/r PULM: Breathing even and unlabored on vent (PEEP 5, FiO2 40%). Lung fields coarse bilaterally GI: Soft, nontender, nondistended. Normoactive bowel sounds Extremities: no edema, posterior RLE thigh wound with pink base Skin: Warm/dry, no significant rashes. Significant sacral decubitus ulcer with pink granulation tissue, slight yellow granulation tissue, no eschar noted  Pertinent labs- WBC 33 Plts 521 Cr 1.31 Ph 5.4 Mag 1.8  Labs/imaging that I have personally reviewed: (right click and "Reselect all SmartList Selections" daily)  CBC, BMP   CXR demonstrating masslike opacity in L lung apex  CT Head stable acute on chronic right sided subdural collection with mild mass effect  Resolved Hospital Problem List:     Assessment & Plan:   64 yoF who experienced cardiopulmonary arrest 5/24 while son was delivering care, ROSC achieved after 8 minutes of CPR. She  was intubated and started on pressor support. CT head showed acute on chronic right sided subdural hematoma and subsequently found to be having a myoclonic seizure.   Acute hypoxic respiratory failure  Acute cardiopulmonary arrest  -Unknown etiology but concern for sepsis  -VAP bundle in place -PAD protocol  -Continue full support mechanical  ventilation  -Daily SAT/SBT if able  -Normothermia protocol  -On norepinephrine  -Echo ordered, obtain when able -Overall poor prognosis  Anoxic brain injury Right subdural hematoma Myoclonic seizure  -CT head shows acute on chronic right sided subdural hematoma, stable.  -EEG shows myoclonic seizure activity, continued after ativan and keppra. Started on valproic acid  -Maintain neuro protective measures  -goal for normothermia, euglycemia, eunatremia, normoxia, and PCO2 goal of 35-40 -Absence of brainstem reflexes on admission -Overall poor prognosis -Minimize sedation  -Seizure precautions  -Aspirations precautions -Neurology on board, appreciate recommendations  Severe sepsis -Unclear source, possibly decubitus sacral ulcer -WBC 33, up from 15 -Lactic acid downtrended -Follow blood and urine cultures  -Continue zoysn, stop vancomycin (MRSA negative) -Continue aggressive IVF today, LR 150 ml/hr -MAP goal > 65  -Monitor urine output  AKI -Cr 1.3, from 0.89 -Likely in the setting of hypotension -Trend BMP  LUE swelling, unilateral -Present for several weeks per son -F/u UE/LE dopplers, obtain when able   Sacral decubitus ulcer - WOC consult  History of diabetes - CBG monitoring   History of hypertension -Not on any home antihypertensives  History of lung cancer s/p resection   Best Practice: (right click and "Reselect all SmartList Selections" daily)  Diet:  NPO Pain/Anxiety/Delirium protocol (if indicated): Yes (RASS goal 0) VAP protocol (if indicated): Yes DVT prophylaxis: Contraindicated GI prophylaxis: PPI Glucose control:  SSI No Central venous access:  N/A Arterial line:  N/A Foley:  Yes, and it is still needed Mobility:  bed rest  PT consulted: N/A Last date of multidisciplinary goals of care discussion [5/25] Code Status:  full code Disposition: ICU  Labs:   CBC: Recent Labs  Lab 2021/02/28 1319 2021/02/28 1324 28-Feb-2021 1506  WBC 15.7*   --   --   HGB 8.0* 8.5* 8.8*  HCT 28.9* 25.0* 26.0*  MCV 83.0  --   --   PLT 515*  --   --     Basic Metabolic Panel: Recent Labs  Lab 2021/02/28 1319 2021-02-28 1324 2021-02-28 1506 2021-02-28 1800  NA 130* 130* 132* 129*  K 4.4 4.3 3.6 4.0  CL 98 96*  --  101  CO2 17*  --   --  22  GLUCOSE 183* 185*  --  239*  BUN 6* 6*  --  8  CREATININE 1.15* 0.80  --  0.89  CALCIUM 7.8*  --   --  6.8*   GFR: Estimated Creatinine Clearance: 40.8 mL/min (by C-G formula based on SCr of 0.89 mg/dL). Recent Labs  Lab 2021/02/28 1319 2021/02/28 1342 02-28-2021 1541  WBC 15.7*  --   --   LATICACIDVEN  --  7.9* 3.2*    Liver Function Tests: No results for input(s): AST, ALT, ALKPHOS, BILITOT, PROT, ALBUMIN in the last 168 hours. No results for input(s): LIPASE, AMYLASE in the last 168 hours. Recent Labs  Lab 02-28-2021 1645  AMMONIA 40*    ABG:    Component Value Date/Time   PHART 7.307 (L) 02-28-2021 1506   PCO2ART 54.8 (H) Feb 28, 2021 1506   PO2ART 546 (H) 28-Feb-2021 1506   HCO3 27.4 02-28-21 1506   TCO2 29 02/28/21  1506   O2SAT 100.0 February 27, 2021 1506     Coagulation Profile: No results for input(s): INR, PROTIME in the last 168 hours.  Cardiac Enzymes: No results for input(s): CKTOTAL, CKMB, CKMBINDEX, TROPONINI in the last 168 hours.  HbA1C: No results found for: HGBA1C  CBG: Recent Labs  Lab 02/27/21 1829 February 27, 2021 2001 Feb 27, 2021 2325 02/08/21 0355  GLUCAP 143* 152* 142* 122*    Review of Systems:   Patient is encephalopathic and/or intubated. Therefore history has been obtained from chart review.   Past Medical History:  She,  has a past medical history of Dementia (HCC), Diabetes mellitus without complication (HCC), and Hypertension.   Surgical History:  L lung resection  Social History:   has an unknown smoking status. She has never used smokeless tobacco.   Family History:  Her family history is not on file.   Allergies Not on File   Home Medications   Prior to Admission medications   Not on File       Kendell Gammon Elaina Pattee, DO 02/08/21 7:41 AM

## 2021-02-08 NOTE — Procedures (Addendum)
Patient Name: Anna Acosta  MRN: [id number]  Epilepsy Attending: Charlsie Quest  Referring Physician/Provider: Dr Lindie Spruce Duration: 01/24/2021 2141 to 02/08/2021 2141  Patient history: 75yo F s/p cardiac arrest. EEG to evaluate for seizure  Level of alertness:  comatose  AEDs during EEG study: LEV, VPA  Technical aspects: This EEG study was done with scalp electrodes positioned according to the 10-20 International system of electrode placement. Electrical activity was acquired at a sampling rate of 500Hz  and reviewed with a high frequency filter of 70Hz  and a low frequency filter of 1Hz . EEG data were recorded continuously and digitally stored.   Description: Patient was noted to have episodes of spontaneous eye opening and axial jerking. Concomitant eeg showed generalized bursts of polyspikes lasting 0.5-3 second consistent with myoclonic seizure.  As AEDs were adjusted, the frequency of seizures improved to once every 20 to 25 minutes but again worsened to once every 3 to 5 seconds. EEG showed generalized suppression between bursts.  Propofol was started around 1415 on 12/17/2020 after which the myoclonic jerks appeared more subtle and were predominantly only seen as eye-opening, occurring every 5-10 seconds. Hyperventilation and photic stimulation were not performed.   ABNORMALITY -Myoclonic seizure, generalized - Burst suppression, generalzied  IMPRESSION: This studyshowed episodes of eye opening and jerking consistent with myoclonic seizures which briefly improved after adjusting AEDs and adding propofol but then again worsened and now happening once every 3 to 5 seconds.  Additionally EEG showed profound diffuse encephalopathy. In setting of cardiac arrest, this is most likely suggestive of anoxic-hypoxic brain injury.   [name] 

## 2021-02-08 NOTE — Progress Notes (Signed)
Upper and lower extremity venous has been completed.   Preliminary results in CV Proc.   Blanch Media 02/08/2021 11:28 AM

## 2021-02-08 NOTE — Plan of Care (Signed)
PCCM Interval Note  Further discussions with the patient's son Anna Acosta at bedside.  His brother is on the way to [place], driving from [facility].  We will likely take a couple days.  Anna Acosta asked if his mother was going to pass away despite the interventions we are making such as norepinephrine, mechanical ventilation, antibiotics etc.  I explained to him that the answer that question was yes but that I did not know when that would take place, and that we had some control over how the end of her life unfolds.  I introduced him to the concept that we may decide to intentionally withdrawal mechanical ventilation and transition her to comfort.  He is not prepared to do this, is not comfortable with this.  Further he understandably wants to support her to allow his brother to arrive.  I have supported his plans to continue her current level of care to allow his brother to arrive.  We will have to talk more going forward about whether an orchestrated withdrawal is appropriate, consistent with the patient's wishes and the sons' wishes.  I did explain that in the meantime I will continue to try to support her with MV, will continue her norepinephrine and even uptitrate some to try by time.  I will also initiate low-dose propofol to try and help with the myoclonus.  I did explain that I do not believe she would survive or benefit from recurrent CPR if she were to expire despite all our efforts.  For this reason I do not believe CPR is appropriate and would not perform.  Given the opportunity ask questions about this, asked questions about the overall plan.  He indicated that he understands the plan as outlined.  Independent CC time: 20 minutes  Levy Pupa, MD, PhD 02/08/2021, 2:48 PM [facility] [phone] or if no answer before 7:00PM call [phone] For any issues after 7:00PM please call eLink [phone]

## 2021-02-08 NOTE — Consult Note (Addendum)
WOC Nurse Consult Note: Reason for Consult: Consult requested for sacrum wound.  Performed remotely after review of progress notes and photos in the EMR.  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  These medical problems would require a surgical consult if they are indicated. Secure chat message sent to primary team to request if desired. Pt is critically ill and on a low airloss mattress to reduce pressure. Topical treatment orders provided for bedside nurses to perform as follows to absorb drainage and provide antimicrobial benefits: Apply Aquacel (lawson # [phone]) to sacrum, using swab to fill deeper areas, then cover with foam dressing.  (change foam dressing Q 3 days or PRN soiling.) Please re-consult if further assistance is needed.  Thank-you,  Cammie Mcgee MSN, RN, CWOCN, [place], CNS [phone]

## 2021-02-08 NOTE — Progress Notes (Signed)
Subjective: Continues to have myoclonic seizures which have been worsening overnight   BWG:YKZLDJ to obtain due to poor mental status  Examination  Vital signs in last 24 hours: Temp:  [92.12 F (33.4 C)-97 F (36.1 C)] 95.72 F (35.4 C) (05/25 1100) Pulse Rate:  [80-182] 101 (05/25 1100) Resp:  [14-29] 23 (05/25 1100) BP: (58-161)/(36-129) 129/57 (05/25 1100) SpO2:  [86 %-100 %] 100 % (05/25 1135) FiO2 (%):  [30 %-100 %] 30 % (05/25 1135) Weight:  [46.6 kg-60 kg] 46.6 kg (05/25 0403)  General: lying in bed, myoclonic seizures every few seconds CVS: pulse-normal rate and rhythm RS: Intubated, initiating breaths on vent Extremities: Warm, edematous Neuro: [name], does not open eyes to noxious stimuli, pupils pinpoint and unable to appreciate any reactivity, corneal reflex absent, gag reflex absent, withdraws to noxious stimuli in all 4 extremities, myoclonic seizures every few seconds  Basic Metabolic Panel: Recent Labs  Lab 01/30/2021 1319 01/24/2021 1324 02/13/2021 1506 02/04/2021 1800 02/08/21 0733  NA 130* 130* 132* 129* 134*  K 4.4 4.3 3.6 4.0 5.0  CL 98 96*  --  101 98  CO2 17*  --   --  22 21*  GLUCOSE 183* 185*  --  239* 131*  BUN 6* 6*  --  8 17  CREATININE 1.15* 0.80  --  0.89 1.31*  CALCIUM 7.8*  --   --  6.8* 7.8*  MG  --   --   --   --  1.8  PHOS  --   --   --   --  5.4*    CBC: Recent Labs  Lab 01/30/2021 1319 02/08/2021 1324 01/24/2021 1506 02/08/21 0733  WBC 15.7*  --   --  33.3*  HGB 8.0* 8.5* 8.8* 9.6*  HCT 28.9* 25.0* 26.0* 32.3*  MCV 83.0  --   --  79.4*  PLT 515*  --   --  521*     Coagulation Studies: No results for input(s): LABPROT, INR in the last 72 hours.  Imaging CT head without contrast 02/08/2021: Stable subdural hematoma with associated dural calcifications suggesting a subacute to chronic etiology. Stable mild mass effect upon the right cerebral hemisphere and minimal right to left midline shift.  Advanced senescent change with  asymmetric atrophy of the a anterior temporal lobes bilaterally. Moderate periventricular white matter changes most in keeping with small vessel ischemic change.    ASSESSMENT AND PLAN: 75 year old female with past medical history of hypertension, diabetes, dementia, prior lung cancer, prior subdural hematoma, failure to thrive, sacral ulcer who presented after cardiac arrest with about 8 minutes of CPR before ROSC.  Patient was noted to have myoclonic seizures which have been resistant to Keppra and Depakote.   Suspected anoxic brain injury Myoclonic seizures Hypertension Diabetes Dementia Sacral ulcer Leukocytosis Microcytic anemia Thrombocytosis Hyponatremia AKI Hyperammonemia -LTM EEG continues to show worsening myoclonic seizures now happening every few seconds  Recommendations -Recommend starting patient on IV propofol or Versed to suppress clinical seizure activity -Continue Keppra and Depakote at current dose -Status myoclonus in the first 24 hours after cardiac arrest is suggestive of significant neurologic injury and poor recovery.  Given patient's poor prior baseline, I suspect patient has suffered irreversible neurologic injury and has minimal to no chances of meaningful neurologic recovery. -Recommend goals of care discussion with family -Management of rest of comorbidities per primary team   CRITICAL CARE Performed by: Charlsie Quest   Total critical care time: 35 minutes  Critical care time  was exclusive of separately billable procedures and treating other patients.  Critical care was necessary to treat or prevent imminent or life-threatening deterioration.  Critical care was time spent personally by me on the following activities: development of treatment plan with patient and/or surrogate as well as nursing, discussions with consultants, evaluation of patient's response to treatment, examination of patient, obtaining history from patient or surrogate,  ordering and performing treatments and interventions, ordering and review of laboratory studies, ordering and review of radiographic studies, pulse oximetry and re-evaluation of patient's condition.     Lindie Spruce Epilepsy [facility] For questions after 5pm please refer to AMION to reach the Neurologist on call

## 2021-02-08 NOTE — Progress Notes (Signed)
PCCM interval note  I spoke with the patient's son Anna Acosta at bedside.  Explained that she has sustained a severe anoxic brain injury based on EEG results, myoclonus/exam.  Also explained that unfortunately there is a very prognosis for any meaningful neurological recovery.  He understood but is understandably having a difficult time processing.  He has hope that there is an intervention to be made to reverse the process.  I explained to him that that is not the case.  I encouraged him to contact his brother who is his closest relative to ensure that he knows as well.  We were joined by the chaplain-appreciate that support.  I did recommend to Anna Acosta that we will have to consider scaling back mechanical ventilation, other life-sustaining support.  He is not prepared to do that at this time.  We will continue to have conversations.  Independent CC time 30 minutes   Levy Pupa, MD, PhD 02/08/2021, 2:09 PM [facility] [phone] or if no answer before 7:00PM call [phone] For any issues after 7:00PM please call eLink [phone]

## 2021-02-08 NOTE — Progress Notes (Addendum)
Brief Neuro Update:  Reviewed repeat CTH with stable SDH.  cEEG appears to be worsening again with recurrenc eof myoclonic seizures. Will do Ativan 2mg  IV once, followed by another Keppra 500mg  IV once and increase maintenance Keppra to 1G BID.  Will continue to monitor EEG.  Update: 04:41: Continue to have myoclonic seizures on EEG after about 20 mins after Keppra nd Ativan were given. Will add Valproic acid 500mg  IV once(~10mg /Kg) and start Valproic acid IV 250mg  BID.  [facility] Pager Number 

## 2021-02-08 NOTE — Progress Notes (Signed)
Chaplain was called by nurse to come to patient's room as doctor was giving son bad news about his mother's condition. Chaplain entered room and sat beside son as doctor was explaining that the patient had no brain waves, was being kept alive by vent and medications. Son was very emotional and crying as he told Chaplain that he and his mother lived in an apartment in [place] but were kicked out because of their inability to pay the rent. They moved to a hotel but their money ran out because the hotel rental was so high. 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 He and his mother stayed in a double room. She had dementia and sometimes she would not eat the food he brought her. She had gotten sick because she was not eating so he started buying fruit and vegetables and he thought she was feeling better. Then yesterday she had the massive heart attack.  He blames himself for the reason she had the heart attack. The doctor and the nurse said it was not his fault. He has no family besides an older brother in [place]. He called him and he is driving to here from there (2 days). He is very confused about what to do. He says his mother wanted to be cremmimated but he does not know what he could do. Chaplain asked the nurse to see if the Case Worker can see Anna Acosta. She said she would put in a consult. She can answer questions that Chaplain cannot. Chaplain will continue to monitor and is available when needed.

## 2021-02-09 LAB — GLUCOSE, CAPILLARY
Glucose-Capillary: 110 mg/dL — ABNORMAL HIGH (ref 70–99)
Glucose-Capillary: 116 mg/dL — ABNORMAL HIGH (ref 70–99)
Glucose-Capillary: 127 mg/dL — ABNORMAL HIGH (ref 70–99)
Glucose-Capillary: 91 mg/dL (ref 70–99)
Glucose-Capillary: 111 mg/dL — ABNORMAL HIGH (ref 70–99)

## 2021-02-09 NOTE — Progress Notes (Addendum)
Initial Nutrition Assessment  DOCUMENTATION CODES:   Severe malnutrition in context of chronic illness  INTERVENTION:   If within GOC, recommend initiation of tube feeds: - Vital AF 1.2 @ 50 ml/hr (1200 ml/day)  Recommended tube feeding regimen would provide 1440 kcal, 90 grams of protein, and 973 ml of H2O.   Recommended tube feeding regimen and current propofol would provide 1588 total kcal (100% of needs)  - Recommend 1 packet Juven BID per tube to promote wound healing, each packet provides 95 calories, 2.5 grams of protein, and 9.8 grams of carbohydrate; also contains L-arginine and L-glutamine, vitamin C, vitamin E, vitamin B-12, zinc, calcium, and calcium Beta-hydroxy-Beta-methylbutyrate to support wound healing  NUTRITION DIAGNOSIS:   Severe Malnutrition related to chronic illness (dementia) as evidenced by severe muscle depletion,severe fat depletion.  GOAL:   Patient will meet greater than or equal to 90% of their needs  MONITOR:   Vent status,Labs,Weight trends,Skin,I & O's  REASON FOR ASSESSMENT:   Ventilator    ASSESSMENT:   75 year old female who presented to the ED on 5/24 after a witnessed cardiac arrest. PMH of HTN, T2DM, dementia, prior lung cancer s/p LUL resection, prior SDH, sacral decubitus ulcer. Pt was intubated in the field.  Per notes, pt with multifactorial shock, evolving myoclonus. Per Neurology, "suspect patient has suffered irreversible neurologic injury and has minimal to no chance of meaningful neurologic recovery." Plan is to maintain ventilatory support until it is determined whether there will be a formal withdrawal of care. Discussed initiation of tube feeds with CCM MD who does not want to start tube feeds at this time. RD will leave recommendations.  Pt with OG tube in place.  No family present at time of RD visit so unable to obtain diet or weight history at this time. No weight history available in chart. Pt with severe  malnutrition.  Patient is currently intubated on ventilator support MV: 8 L/min Temp (24hrs), Avg:96 F (35.6 C), Min:93.38 F (34.1 C), Max:99.68 F (37.6 C) BP (cuff): 127/74 MAP (cuff): 87  Drips: Propofol: 5.6 ml/hr (provides 148 kcal daily from lipid) LR: 150 ml/hr Levophed  Medications reviewed and include: protonix, IV abx, keppra  Labs reviewed: sodium 134, creatinine 1.31, phosphorus 5.4, hemoglobin 9.6 CBG's: 110-131 x 24 hours  UOP: 565 ml x 24 hours I/O's: +4.5 L since admit  NUTRITION - FOCUSED PHYSICAL EXAM:  Flowsheet Row Most Recent Value  Orbital Region Severe depletion  Upper Arm Region Severe depletion  Thoracic and Lumbar Region Severe depletion  Buccal Region Severe depletion  Temple Region Severe depletion  Clavicle Bone Region Severe depletion  Clavicle and Acromion Bone Region Severe depletion  Scapular Bone Region Unable to assess  Dorsal Hand Unable to assess  [edema]  Patellar Region Severe depletion  Anterior Thigh Region Severe depletion  Posterior Calf Region Severe depletion  Edema (RD Assessment) Moderate  [BUE]  Hair Reviewed  Eyes Unable to assess  Mouth Unable to assess  Skin Reviewed  Nails Reviewed       Diet Order:   Diet Order            Diet NPO time specified  Diet effective now                 EDUCATION NEEDS:   No education needs have been identified at this time  Skin:  Skin Assessment: Skin Integrity Issues: Stage II: R thigh Stage IV: sacrum  Last BM:  02/08/21 type 6  Height:  Ht Readings from Last 1 Encounters:  01/21/2021 5' 4.02" (1.626 m)    Weight:   Wt Readings from Last 1 Encounters:  02/09/21 49.7 kg    BMI:  Body mass index is 18.8 kg/m.  Estimated Nutritional Needs:   Kcal:  1400-1600  Protein:  75-90 grams  Fluid:  >1.5 L/day    Mertie Clause, MS, RD, LDN Inpatient Clinical Dietitian Please see AMiON for contact information.

## 2021-02-09 NOTE — Progress Notes (Signed)
Subjective: Continues to have myoclonic seizures even though on propofol.  ROS: Unable to obtain due to poor mental status  Examination  Vital signs in last 24 hours: Temp:  [93.38 F (34.1 C)-99.68 F (37.6 C)] 99.4 F (37.4 C) (05/26 1428) Pulse Rate:  [72-106] 88 (05/26 1600) Resp:  [16-24] 18 (05/26 1600) BP: (63-183)/(37-92) 111/73 (05/26 1600) SpO2:  [93 %-100 %] 100 % (05/26 1610) FiO2 (%):  [30 %] 30 % (05/26 1610) Weight:  [49.7 kg] 49.7 kg (05/26 0445)  General: lying in bed, myoclonic seizures every few seconds CVS: pulse-normal rate and rhythm RS: Intubated, initiating breaths on vent Extremities: Warm, edematous Neuro: [name], does not open eyes to noxious stimuli, pupils pinpoint and unable to appreciate any reactivity, corneal reflex absent, gag reflex absent, withdraws to noxious stimuli in all 4 extremities, myoclonic seizures every few seconds  Basic Metabolic Panel: Recent Labs  Lab 02/02/2021 1319 02/11/2021 1324 01/20/2021 1506 02/09/2021 1800 02/08/21 0733  NA 130* 130* 132* 129* 134*  K 4.4 4.3 3.6 4.0 5.0  CL 98 96*  --  101 98  CO2 17*  --   --  22 21*  GLUCOSE 183* 185*  --  239* 131*  BUN 6* 6*  --  8 17  CREATININE 1.15* 0.80  --  0.89 1.31*  CALCIUM 7.8*  --   --  6.8* 7.8*  MG  --   --   --   --  1.8  PHOS  --   --   --   --  5.4*    CBC: Recent Labs  Lab 02/06/2021 1319 02/09/2021 1324 01/23/2021 1506 02/08/21 0733  WBC 15.7*  --   --  33.3*  HGB 8.0* 8.5* 8.8* 9.6*  HCT 28.9* 25.0* 26.0* 32.3*  MCV 83.0  --   --  79.4*  PLT 515*  --   --  521*     Coagulation Studies: No results for input(s): LABPROT, INR in the last 72 hours.  Imaging No new brain imaging overnight  ASSESSMENT AND PLAN: 75 year old female with past medical history of hypertension, diabetes, dementia, prior lung cancer, prior subdural hematoma, failure to thrive, sacral ulcer who presented after cardiac arrest with about 8 minutes of CPR before ROSC.  Patient was  noted to have myoclonic seizures which have been resistant to Keppra and Depakote.   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  Given patient's poor prior baseline, I suspect patient has suffered irreversible neurologic injury and has minimal to no chances of meaningful neurologic recovery. -Continue goals of care discussion with family -Management of rest of comorbidities per primary team  I have spent a total of 25  minutes with the patient reviewing hospital notes,  test results, labs and examining the patient as well as establishing an assessment and plan.  > 50% of time was spent in direct patient care.     Lindie Spruce Epilepsy [facility] For questions after 5pm please refer to AMION to reach the Neurologist on call

## 2021-02-09 NOTE — Progress Notes (Signed)
NAME:  Anna Acosta MRN:  [id number] DOB:  06-Mar-1946 LOS: 2 ADMISSION DATE:  2021/02/13 CONSULTATION DATE:  02/13/21 REFERRING MD:  Myrtis Ser CHIEF COMPLAINT:  Cardiac arrest   History of Present Illness:  75 year old chronically unwell female with PMHx significant for HTN, T2DM, lung cancer (s/p resection), dementia and FTT who presented to [facility] ED 5/24 via EMS s/p cardiac arrest. Patient's son was reportedly caring for her sacral decubitus ulcer when he turned her back over and realized she was not breathing/pulseless. Patient's son immediately started CPR. Initial rhythm on first responder arrival was asystole with completion of CPR x 2 rounds (8 total minutes) and Epi x 1 with ROSC. King airway was placed in the field, exchanged to ETT. Low-dose Levophed was initiated.  On arrival to ED, labs notable for WBC 15K, H&H 8.0/28.9, Plt 515. Na 130, K 4.4, CO2 17, BUN 6, Cr 1.15. Trop 19, LA 7.9. ABG 7.370/54.8/546/27.4. CXR demonstrating masslike opacity in L lung apex. CT Head demonstrating mixed density R subdural collection (acute/chronic) with mild mass effect, slight < 39mm R to L midline shift.  PCCM consulted for admission.  Pertinent Medical History:  HTN, T2DM, dementia, lung CA (s/p resection), FTT  Significant Hospital Events: Including procedures, antibiotic start and stop dates in addition to other pertinent events   . 5/24 - BIB EMS s/p cardiac arrest; CPR x 2 rounds (8 total minutes) and Epi x 1 with ROSC. CT Head with mixed acute/chronic subdural collection with mass effect/slight midline shift. Elevated LA. On low dose pressors.  Interim History / Subjective:   Propofol stopped initially as it was not helping myoclonus, was causing hypotension. Now at 20 I/O + 4.4L   Objective:  Blood pressure (!) 171/92, pulse (!) 106, temperature 98.96 F (37.2 C), resp. rate 20, height 5' 4.02" (1.626 m), weight 49.7 kg, SpO2 100 %.    Vent Mode: PRVC FiO2 (%):  [30 %-40 %] 30 % Set  Rate:  [18 bmp] 18 bmp Vt Set:  [430 mL] 430 mL PEEP:  [5 cmH20] 5 cmH20 Plateau Pressure:  [19 cmH20-22 cmH20] 20 cmH20   Intake/Output Summary (Last 24 hours) at 02/09/2021 1856 Last data filed at 02/09/2021 0430 Gross per 24 hour  Intake 4328.29 ml  Output 565 ml  Net 3763.29 ml   Filed Weights   02-13-21 1820 02/08/21 0403 02/09/21 0445  Weight: 47.4 kg 46.6 kg 49.7 kg   Physical Examination: General:  Appearing woman, ventilated HEENT:  ET tube in place Neuro:  [name], negative corneals, she still has myoclonic movements of her mouth and lips but not of her extremities CV:  Regular, distant, no murmur PULM:  Clear bilaterally GI:  Nondistended, positive bowel sounds Extremities: no edema, posterior RLE thigh wound with pink base  Skin: Warm/dry, no significant rashes. Significant sacral decubitus ulcer with pink granulation tissue, slight yellow granulation tissue, no eschar noted   Labs/imaging that I have personally reviewed: (right click and "Reselect all SmartList Selections" daily)   All studies and labs reviewed 5/26  Resolved Hospital Problem List:     Assessment & Plan:   51 yoF who experienced cardiopulmonary arrest 5/24 while son was delivering care, ROSC achieved after 8 minutes of CPR. She was intubated and started on pressor support. CT head showed acute on chronic right sided subdural hematoma and subsequently found to be having a myoclonic seizure.   Acute hypoxic respiratory failure  Acute cardiopulmonary arrest  -Etiology unclear, consider sepsis.  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  Appreciate neurology input.  I discussed this with the patient's son.  Explained  that she will not have a neurological recovery. -Will need to continue to discuss goals of care, consider withdrawal of care.  At this time we are try to maintain stability to allow patient's other son to arrive to see her. -Seizure precautions -Propofol and antiepileptics as ordered for her myoclonus  Severe sepsis -Unclear source, possibly decubitus sacral ulcer -Continue empiric Zosyn for now, hopefully maintain stability to allow family to visit.  Plan to discontinue when all have gathered  AKI -Hold off on further labs. -She is not a dialysis candidate due to her neurological injury and underlying clinical conditions  LUE swelling, unilateral -No evidence of upper or lower extremity DVT on Doppler ultrasound   Sacral decubitus ulcer -Appreciate W OC consult  History of diabetes -DC CBGs  History of hypertension -following  History of lung cancer s/p resection -no intervention at this time.   Best Practice: (right click and "Reselect all SmartList Selections" daily)  Diet:  NPO Pain/Anxiety/Delirium protocol (if indicated): Yes (RASS goal 0) VAP protocol (if indicated): Yes DVT prophylaxis: Contraindicated GI prophylaxis: PPI Glucose control:  SSI No Central venous access:  N/A Arterial line:  N/A Foley:  Yes, and it is still needed Mobility:  bed rest  PT consulted: N/A Last date of multidisciplinary goals of care discussion [5/25 with pt's son [name] at bedside] Code Status:  DNR Disposition: ICU  Labs:   CBC: Recent Labs  Lab 02/01/2021 1319 01/26/2021 1324 01/27/2021 1506 02/08/21 0733  WBC 15.7*  --   --  33.3*  HGB 8.0* 8.5* 8.8* 9.6*  HCT 28.9* 25.0* 26.0* 32.3*  MCV 83.0  --   --  79.4*  PLT 515*  --   --  521*    Basic Metabolic Panel: Recent Labs  Lab 01/31/2021 1319 02/06/2021 1324 01/31/2021 1506 02/09/2021 1800 02/08/21 0733  NA 130* 130* 132* 129* 134*  K 4.4 4.3 3.6 4.0 5.0  CL 98 96*  --  101 98  CO2 17*  --   --  22 21*  GLUCOSE 183* 185*   --  239* 131*  BUN 6* 6*  --  8 17  CREATININE 1.15* 0.80  --  0.89 1.31*  CALCIUM 7.8*  --   --  6.8* 7.8*  MG  --   --   --   --  1.8  PHOS  --   --   --   --  5.4*   GFR: Estimated Creatinine Clearance: 29.6 mL/min (A) (by C-G formula based on SCr of 1.31 mg/dL (H)). Recent Labs  Lab 01/18/2021 1319 02/08/2021 1342 02/04/2021 1541 02/08/21 0733  WBC 15.7*  --   --  33.3*  LATICACIDVEN  --  7.9* 3.2*  --     Liver Function Tests: No results for input(s): AST, ALT, ALKPHOS, BILITOT, PROT, ALBUMIN in the last 168 hours. No results for input(s): LIPASE, AMYLASE in the last 168 hours. Recent Labs  Lab 01/23/2021 1645  AMMONIA 40*    ABG:    Component Value Date/Time   PHART 7.307 (L) 01/29/2021 1506   PCO2ART 54.8 (H) 01/18/2021 1506   PO2ART 546 (H) 01/18/2021 1506  HCO3 27.4 03/05/2021 1506   TCO2 29 Mar 05, 2021 1506   O2SAT 100.0 03-05-2021 1506     Coagulation Profile: No results for input(s): INR, PROTIME in the last 168 hours.  Cardiac Enzymes: No results for input(s): CKTOTAL, CKMB, CKMBINDEX, TROPONINI in the last 168 hours.  HbA1C: No results found for: HGBA1C  CBG: Recent Labs  Lab 02/08/21 1145 02/08/21 1502 02/08/21 1957 02/08/21 2341 02/09/21 0330  GLUCAP 124* 119* 131* 119* 110*     Levy Pupa, MD, PhD 02/09/2021, 8:52 AM [facility] [phone] or if no answer before 7:00PM call [phone] For any issues after 7:00PM please call eLink [phone]

## 2021-02-09 NOTE — Progress Notes (Signed)
D/C LTM EEG at apx 11:00 am. No skin breakdown noted.

## 2021-02-09 NOTE — Procedures (Addendum)
Patient Name:Anna Acosta PIR:[id number] Epilepsy Attending:Trea Carnegie Annabelle Harman Referring Physician/Provider:Dr Lindie Spruce Duration:02/08/2021 2141 to 02/09/2021 1055  Patient history:74yo F s/p cardiac arrest. EEG to evaluate for seizure  Level of alertness:comatose  AEDs during EEG study:LEV, VPA, Propofol  Technical aspects: This EEG study was done with scalp electrodes positioned according to the 10-20 International system of electrode placement. Electrical activity was acquired at a sampling rate of 500Hz  and reviewed with a high frequency filter of 70Hz  and a low frequency filter of 1Hz . EEG data were recorded continuously and digitally stored.   Description:EEG initially showed episodes of spontaneous eye opening and axial jerking every 2 to 5 seconds.  Concomitant EEG showed burst of generalized polyspike consistent with myoclonic seizure.  In between bursts, EEG showed generalized suppression.  Around 0100 02/09/2021, the frequency of myoclonic seizures worsened and were happening every second consistent with myoclonic status epilepticus.  ABNORMALITY -Myoclonic status epilepticus, generalized - Burst suppression with highly epileptiform discharges, generalzied  IMPRESSION: This studyinitially showed myoclonic seizures happening every 2 to 5 seconds.  After around 0100 on 02/09/2021, frequency of myoclonic seizures worsened and they were happening every second consistent with myoclonic status epilepticus. Additionally EEG showed profound diffuse encephalopathy. In setting of cardiac arrest, this is most likely suggestive of anoxic-hypoxic brain injury.  EEG appears to be worsening compared to previous day.   Anna Acosta 

## 2021-02-09 NOTE — Plan of Care (Signed)
PCCM:  Case reviewed with Dr. Delton Coombes. PMH HTN, DMII, lung Ca s/p asystolic cardiac arrest, now with severe anoxic brain injury, EEG with myoclonic status despite attempts at burst suppression, ct with SDH. I agree with Dr. Kavin Leech assessment of poor prognosis and irreversible brain injury.   Anna Igo, DO [facility] 02/09/2021 1:59 PM

## 2021-02-10 DIAGNOSIS — I9589 Other hypotension: Secondary | ICD-10-CM

## 2021-02-10 LAB — GLUCOSE, CAPILLARY
Glucose-Capillary: 112 mg/dL — ABNORMAL HIGH (ref 70–99)
Glucose-Capillary: 118 mg/dL — ABNORMAL HIGH (ref 70–99)
Glucose-Capillary: 65 mg/dL — ABNORMAL LOW (ref 70–99)
Glucose-Capillary: 67 mg/dL — ABNORMAL LOW (ref 70–99)
Glucose-Capillary: 71 mg/dL (ref 70–99)
Glucose-Capillary: 73 mg/dL (ref 70–99)
Glucose-Capillary: 79 mg/dL (ref 70–99)
Glucose-Capillary: 82 mg/dL (ref 70–99)
Glucose-Capillary: 94 mg/dL (ref 70–99)

## 2021-02-10 MED ORDER — DEXTROSE 50 % IV SOLN: 12.5000 g | INTRAVENOUS | Status: AC

## 2021-02-10 MED ORDER — DEXTROSE 50 % IV SOLN
INTRAVENOUS | Status: AC
  Administered 2021-02-10: 25 mL
  Filled 2021-02-10: qty 50

## 2021-02-10 MED ORDER — DEXTROSE 50 % IV SOLN
INTRAVENOUS | Status: AC
  Administered 2021-02-10: 12.5 g via INTRAVENOUS
  Filled 2021-02-10: qty 50

## 2021-02-10 NOTE — Progress Notes (Signed)
Hypoglycemic Event  CBG: 98  Treatment: D50 25 mL (12.5 gm)  Symptoms: None  Follow-up CBG: Time:0405 CBG Result:118  Possible Reasons for Event: Inadequate meal intake  Comments/MD notified:per protocol    Terrilyn Saver

## 2021-02-10 NOTE — Progress Notes (Signed)
Subjective:   ROS: Unable to obtain due to poor mental status  Examination  Vital signs in last 24 hours: Temp:  [92.4 F (33.6 C)-99.4 F (37.4 C)] 97.5 F (36.4 C) (05/27 1202) Pulse Rate:  [70-106] 100 (05/27 1300) Resp:  [18-21] 18 (05/27 1300) BP: (55-202)/(31-96) 127/80 (05/27 1300) SpO2:  [98 %-100 %] 100 % (05/27 1200) FiO2 (%):  [30 %] 30 % (05/27 1200) Weight:  [49.3 kg] 49.3 kg (05/27 0400)  General: lying in bed,myoclonic seizures everyfewseconds CVS: pulse-normal rate and rhythm BP:[id number], initiating breaths on vent Extremities:Warm, edematous Neuro:[name], does not open eyes to noxious stimuli, pupils pinpoint and unable to appreciate any reactivity, corneal reflex absent, gag reflex absent, withdraws to noxious stimuli in all 4 extremities, myoclonic seizures everyfewseconds  Basic Metabolic Panel: Recent Labs  Lab 02-16-2021 1319 2021/02/16 1324 Feb 16, 2021 1506 2021/02/16 1800 02/08/21 0733  NA 130* 130* 132* 129* 134*  K 4.4 4.3 3.6 4.0 5.0  CL 98 96*  --  101 98  CO2 17*  --   --  22 21*  GLUCOSE 183* 185*  --  239* 131*  BUN 6* 6*  --  8 17  CREATININE 1.15* 0.80  --  0.89 1.31*  CALCIUM 7.8*  --   --  6.8* 7.8*  MG  --   --   --   --  1.8  PHOS  --   --   --   --  5.4*    CBC: Recent Labs  Lab Feb 16, 2021 1319 February 16, 2021 1324 16-Feb-2021 1506 02/08/21 0733  WBC 15.7*  --   --  33.3*  HGB 8.0* 8.5* 8.8* 9.6*  HCT 28.9* 25.0* 26.0* 32.3*  MCV 83.0  --   --  79.4*  PLT 515*  --   --  521*     Coagulation Studies: No results for input(s): LABPROT, INR in the last 72 hours.  Imaging No new brain imaging overnight  ASSESSMENT AND PLAN: 76 year old female with past medical history of hypertension, diabetes, dementia, prior lung cancer, prior subdural hematoma, failure to thrive, sacral ulcer who presented after cardiac arrest with about 8 minutes of CPR before ROSC. Patient was noted to have myoclonic seizures which have been resistant to  Keppra and Depakote.  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 Given patient's poor prior baseline, I suspect patient has suffered irreversible neurologic injury and has minimal to no chances of meaningful neurologic recovery. -Continue goals of care discussion with family -Management of rest of comorbidities per primary team  I have spent a total of25  minuteswith the patient reviewing hospitalnotes,  test results, labs and examining the patient as well as establishing an assessment and plan.>50% of time was spent in direct patient care.  Lindie Spruce Epilepsy [facility] For questions after 5pm please refer to AMION to reach the Neurologist on call

## 2021-02-10 NOTE — Progress Notes (Signed)
NAME:  Anna Acosta MRN:  [id number] DOB:  28-Jan-1946 LOS: 3 ADMISSION DATE:  01/24/2021 CONSULTATION DATE:  02/06/2021 REFERRING MD:  Myrtis Ser CHIEF COMPLAINT:  Cardiac arrest   History of Present Illness:  75 year old chronically unwell female with PMHx significant for HTN, T2DM, lung cancer (s/p resection), dementia and FTT who presented to [facility] ED 5/24 via EMS s/p cardiac arrest. Patient's son was reportedly caring for her sacral decubitus ulcer when he turned her back over and realized she was not breathing/pulseless. Patient's son immediately started CPR. Initial rhythm on first responder arrival was asystole with completion of CPR x 2 rounds (8 total minutes) and Epi x 1 with ROSC. King airway was placed in the field, exchanged to ETT. Low-dose Levophed was initiated.  On arrival to ED, labs notable for WBC 15K, H&H 8.0/28.9, Plt 515. Na 130, K 4.4, CO2 17, BUN 6, Cr 1.15. Trop 19, LA 7.9. ABG 7.370/54.8/546/27.4. CXR demonstrating masslike opacity in L lung apex. CT Head demonstrating mixed density R subdural collection (acute/chronic) with mild mass effect, slight < 25mm R to L midline shift.  PCCM consulted for admission.  Pertinent Medical History:  HTN, T2DM, dementia, lung CA (s/p resection), FTT  Significant Hospital Events: Including procedures, antibiotic start and stop dates in addition to other pertinent events   . 5/24 - BIB EMS s/p cardiac arrest; CPR x 2 rounds (8 total minutes) and Epi x 1 with ROSC. CT Head with mixed acute/chronic subdural collection with mass effect/slight midline shift. Elevated LA. On low dose pressors.  Interim History / Subjective:   Remains on propofol and norepinephrine via peripheral IV Continues to have myoclonic movements face, left upper extremity   Objective:  Blood pressure (!) 88/56, pulse (!) 106, temperature (!) 97.5 F (36.4 C), temperature source Axillary, resp. rate 20, height 5' 4.02" (1.626 m), weight 49.3 kg, SpO2 100 %.     Vent Mode: PRVC FiO2 (%):  [30 %] 30 % Set Rate:  [18 bmp] 18 bmp Vt Set:  [430 mL] 430 mL PEEP:  [5 cmH20] 5 cmH20 Plateau Pressure:  [21 cmH20-22 cmH20] 21 cmH20   Intake/Output Summary (Last 24 hours) at 02/10/2021 1213 Last data filed at 02/10/2021 1056 Gross per 24 hour  Intake 2334.37 ml  Output 1045 ml  Net 1289.37 ml   Filed Weights   02/08/21 0403 02/09/21 0445 02/10/21 0400  Weight: 46.6 kg 49.7 kg 49.3 kg   Physical Examination: General:  Acutely ill-appearing, ventilated HEENT:  ET tube in place Neuro:  [name], myoclonic movements of face and left upper extremity, no gag, negative corneals CV:  Regular, distant, no murmur PULM:  Clear bilaterally GI:  Nondistended, positive bowel sounds Extremities:  Right lower extremity thigh wound Skin: Warm/dry, no rash. Significant sacral decubitus ulcer with pink granulation tissue, slight yellow granulation tissue, no eschar noted   Labs/imaging that I have personally reviewed: (right click and "Reselect all SmartList Selections" daily)   No new studies 5/27  Resolved Hospital Problem List:     Assessment & Plan:   36 yoF who experienced cardiopulmonary arrest 5/24 while son was delivering care, ROSC achieved after 8 minutes of CPR. She was intubated and started on pressor support. CT head showed acute on chronic right sided subdural hematoma and subsequently found to be having a myoclonic seizure.   Acute hypoxic respiratory failure  Acute cardiopulmonary arrest  -Etiology unclear, consider sepsis.  Left ventricular function intact on echocardiogram 5/25 -Plan to maintain ventilatory support for  now while we try to gather family.  Have broached the subject of transition to comfort care.  Patient son is not ready to do this for now.  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  I discussed this with her son Anna Acosta.  He is understandably overwhelmed.  We will continue to discuss goals of care, consider withdrawal of care.  Her other son is supposed to be coming to [facility], hopefully will also participate. -Seizure precautions -Propofol and antiepileptics as ordered  Severe sepsis -On empiric Zosyn -Follow cultures -Wound care for her sacral ulcer  AKI -Deferring further labs.  She is not a dialysis candidate due to her neurological injury  LUE swelling, unilateral -No evidence of upper or lower extremity DVT on ultrasound  Sacral decubitus ulcer -Appreciate W OC consult  History of diabetes -Stop CBGs  History of hypertension -Currently hypotensive, no intervention at this time  History of lung cancer s/p resection -No intervention  Best Practice: (right click and "Reselect all SmartList Selections" daily)  Diet:  NPO Pain/Anxiety/Delirium protocol (if indicated): Yes (RASS goal 0) VAP protocol (if indicated): Yes DVT prophylaxis: Contraindicated GI prophylaxis: PPI Glucose control:  SSI No Central venous access:  N/A Arterial line:  N/A Foley:  Yes, and it is still needed Mobility:  bed rest  PT consulted: N/A Last date of multidisciplinary goals of care discussion [5/25 with pt's son Anna Acosta at bedside] Code Status:  DNR Disposition: ICU Family: Attempt to call Anna Acosta this morning, unable to reach him 5/27  Labs:   CBC: Recent Labs  Lab 01/20/2021 1319 02/05/2021 1324 02/04/2021 1506 02/08/21 0733  WBC 15.7*  --   --  33.3*  HGB 8.0* 8.5* 8.8* 9.6*  HCT 28.9* 25.0* 26.0* 32.3*  MCV 83.0  --   --  79.4*  PLT 515*  --   --  521*    Basic Metabolic Panel: Recent Labs  Lab 01/21/2021 1319 01/24/2021 1324 02/08/2021 1506 02/05/2021 1800 02/08/21 0733  NA 130* 130* 132* 129* 134*  K 4.4 4.3 3.6 4.0 5.0  CL 98 96*  --  101 98  CO2 17*  --   --  22 21*  GLUCOSE 183* 185*  --  239* 131*  BUN 6* 6*  --  8 17  CREATININE  1.15* 0.80  --  0.89 1.31*  CALCIUM 7.8*  --   --  6.8* 7.8*  MG  --   --   --   --  1.8  PHOS  --   --   --   --  5.4*   GFR: Estimated Creatinine Clearance: 29.3 mL/min (A) (by C-G formula based on SCr of 1.31 mg/dL (H)). Recent Labs  Lab 02/11/2021 1319 02/09/2021 1342 02/10/2021 1541 02/08/21 0733  WBC 15.7*  --   --  33.3*  LATICACIDVEN  --  7.9* 3.2*  --     Liver Function Tests: No results for input(s): AST, ALT, ALKPHOS, BILITOT, PROT, ALBUMIN in the last 168 hours. No results for input(s): LIPASE, AMYLASE in the last 168 hours. Recent Labs  Lab 01/26/2021 1645  AMMONIA 40*    ABG:    Component Value Date/Time   PHART 7.307 (L) 01/20/2021 1506   PCO2ART 54.8 (H) 01/20/2021 1506   PO2ART 546 (H) 01/29/2021 1506   HCO3 27.4 02/11/2021 1506   TCO2 29 02/05/2021 1506  O2SAT 100.0 02/06/2021 1506     Coagulation Profile: No results for input(s): INR, PROTIME in the last 168 hours.  Cardiac Enzymes: No results for input(s): CKTOTAL, CKMB, CKMBINDEX, TROPONINI in the last 168 hours.  HbA1C: No results found for: HGBA1C  CBG: Recent Labs  Lab 02/10/21 0216 02/10/21 0333 02/10/21 0405 02/10/21 0819 02/10/21 1143  GLUCAP 73 65* 118* 94 79     Levy Pupa, MD, PhD 02/10/2021, 12:13 PM [facility] [phone] or if no answer before 7:00PM call [phone] For any issues after 7:00PM please call eLink [phone]

## 2021-02-11 ENCOUNTER — Encounter (HOSPITAL_COMMUNITY): Payer: Self-pay | Admitting: Emergency Medicine

## 2021-02-11 ENCOUNTER — Other Ambulatory Visit: Payer: Self-pay

## 2021-02-11 DIAGNOSIS — E43 Unspecified severe protein-calorie malnutrition: Secondary | ICD-10-CM | POA: Insufficient documentation

## 2021-02-11 DIAGNOSIS — S065X9A Traumatic subdural hemorrhage with loss of consciousness of unspecified duration, initial encounter: Secondary | ICD-10-CM

## 2021-02-11 DIAGNOSIS — G931 Anoxic brain damage, not elsewhere classified: Secondary | ICD-10-CM

## 2021-02-11 LAB — GLUCOSE, CAPILLARY
Glucose-Capillary: 79 mg/dL (ref 70–99)
Glucose-Capillary: 84 mg/dL (ref 70–99)

## 2021-02-11 NOTE — Plan of Care (Signed)
PCCM Goals of Care Discussion and Advanced Care Planning:   Date: 02/11/2021   Present Parties: Son, daughter in law, grand daughter  What was discussed:   I met with patients family at bedside. I discussed prognosis regarding cardiac arrest and persistent myoclonic status epilepticus.   They have a good understanding of the patients poor prognosis and agree with her DNR status. There is another son that is having trouble coping with this.   He wants to go and talk with his brother to come up with a plan. Right now he thinks they should consider withdrawal of care from vent support and focus on comfort but not yet ready to make this decision.   Outcome:   Remains on vent  DNR No escalation of care.  22 mins of time was spent discussing the goals of care, advanced care planning options such as code status as well as do not resuscitate forms. [phone])   Garner Nash, DO [place] [facility] 02/11/2021 5:21 PM

## 2021-02-11 NOTE — Progress Notes (Signed)
NAME:  Anna Acosta MRN:  [id number] DOB:  09-11-1946 LOS: 4 ADMISSION DATE:  02/02/2021 CONSULTATION DATE:  01/17/2021 REFERRING MD:  Myrtis Ser CHIEF COMPLAINT:  Cardiac arrest   History of Present Illness:  75 year old chronically unwell female with PMHx significant for HTN, T2DM, lung cancer (s/p resection), dementia and FTT who presented to [facility] ED 5/24 via EMS s/p cardiac arrest. Patient's son was reportedly caring for her sacral decubitus ulcer when he turned her back over and realized she was not breathing/pulseless. Patient's son immediately started CPR. Initial rhythm on first responder arrival was asystole with completion of CPR x 2 rounds (8 total minutes) and Epi x 1 with ROSC. King airway was placed in the field, exchanged to ETT. Low-dose Levophed was initiated.  On arrival to ED, labs notable for WBC 15K, H&H 8.0/28.9, Plt 515. Na 130, K 4.4, CO2 17, BUN 6, Cr 1.15. Trop 19, LA 7.9. ABG 7.370/54.8/546/27.4. CXR demonstrating masslike opacity in L lung apex. CT Head demonstrating mixed density R subdural collection (acute/chronic) with mild mass effect, slight < 30mm R to L midline shift.  PCCM consulted for admission.  Pertinent Medical History:  HTN, T2DM, dementia, lung CA (s/p resection), FTT  Significant Hospital Events: Including procedures, antibiotic start and stop dates in addition to other pertinent events   . 5/24 - BIB EMS s/p cardiac arrest; CPR x 2 rounds (8 total minutes) and Epi x 1 with ROSC. CT Head with mixed acute/chronic subdural collection with mass effect/slight midline shift. Elevated LA. On low dose pressors.  Interim History / Subjective:   Patient remains critically ill intubated on mechanical life support sedated on propofol to help mask ongoing myoclonic movements.  Remains on norepinephrine to maintain mean arterial pressure greater than 65 mmHg.  Objective:  Blood pressure 122/71, pulse 98, temperature 98.8 F (37.1 C), temperature source Oral,  resp. rate 18, height 5' 4.02" (1.626 m), weight 49.7 kg, SpO2 100 %.    Vent Mode: PRVC FiO2 (%):  [30 %] 30 % Set Rate:  [18 bmp] 18 bmp Vt Set:  [430 mL] 430 mL PEEP:  [5 cmH20] 5 cmH20 Plateau Pressure:  [21 cmH20-31 cmH20] 26 cmH20   Intake/Output Summary (Last 24 hours) at 02/11/2021 0946 Last data filed at 02/11/2021 0800 Gross per 24 hour  Intake 1176.31 ml  Output 875 ml  Net 301.31 ml   Filed Weights   02/09/21 0445 02/10/21 0400 02/11/21 0500  Weight: 49.7 kg 49.3 kg 49.7 kg   Physical Examination: General:  Critically ill elderly female intubated mechanical life support HEENT:  Endotracheal tube in place Neuro:  Patient is comatose, myoclonic jerks of the face and upper extremity no cough no gag CV:  Regular rate rhythm, S1-S2 PULM:  Bilateral mechanically ventilated breath sounds GI:  Soft, nontender nondistended bowel sounds present Extremities:  No significant edema, right extremity thigh wound Skin: Sacral decubitus per nursing documentation.  Please see flow chart below.  Pressure Injury 01/29/2021 Sacrum Anterior;Mid Stage 4 - Full thickness tissue loss with exposed bone, tendon or muscle. 5cmx5cm (Active)  02/09/2021 1820  Location: Sacrum  Location Orientation: Anterior;Mid  Staging: Stage 4 - Full thickness tissue loss with exposed bone, tendon or muscle.  Wound Description (Comments): 5cmx5cm  Present on Admission: Yes     Pressure Injury 01/19/2021 Thigh Posterior;Right Stage 2 -  Partial thickness loss of dermis presenting as a shallow open injury with a red, pink wound bed without slough. (Active)  01/24/2021 1820  Location:  Thigh  Location Orientation: Posterior;Right  Staging: Stage 2 -  Partial thickness loss of dermis presenting as a shallow open injury with a red, pink wound bed without slough.  Wound Description (Comments):   Present on Admission:     Labs/imaging that I have personally reviewed: (right click and "Reselect all SmartList Selections"  daily)   No new studies 5/27  Resolved Hospital Problem List:     Assessment & Plan:   70 yoF who experienced cardiopulmonary arrest 5/24 while son was delivering care, ROSC achieved after 8 minutes of CPR. She was intubated and started on pressor support. CT head showed acute on chronic right sided subdural hematoma and subsequently found to be having a myoclonic seizure.   Acute hypoxic respiratory failure  Acute cardiopulmonary arrest  -Etiology unclear, consider sepsis.  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 We are awaiting more family to arrive. Remains critically ill on vasopressors and intubated at this time due to above. Continue propofol and antiepileptics to help curb ongoing myoclonic jerks. Her overall chance of neurologic recovery I think he has poor MRI brain pending Needs ongoing palliative care and goals of care discussion.  Severe sepsis -Continue Zosyn Follow cultures Wound care for sacral ulcer  AKI Plan: Not a candidate for dialysis due to severe anoxic brain injury  LUE swelling, unilateral -Doppler extremity negative for DVT  Sacral decubitus ulcer -Wound care consult appreciated  History of diabetes -CBGs  History of hypertension Hypotensive, holding intervention  History of lung cancer s/p resection Outpatient follow-up if survives  Goals of care: Consult placed to palliative care Currently DNR  Best Practice: (right click and "Reselect all SmartList Selections" daily)  Diet:  NPO Pain/Anxiety/Delirium protocol (if indicated): Yes (RASS goal 0) VAP protocol (if indicated): Yes DVT prophylaxis: Contraindicated GI prophylaxis: PPI Glucose control:  SSI No Central venous access:  N/A Arterial line:  N/A Foley:  Yes, and it is still needed Mobility:  bed rest  PT consulted: N/A Last  date of multidisciplinary goals of care discussion [5/25 with pt's son [name] at bedside] Code Status:  DNR Disposition: ICU Family: I tried calling family this morning.  No answer on phone numbers provided within the chart.  Labs:   CBC: Recent Labs  Lab Feb 26, 2021 1319 Feb 26, 2021 1324 26-Feb-2021 1506 02/08/21 0733  WBC 15.7*  --   --  33.3*  HGB 8.0* 8.5* 8.8* 9.6*  HCT 28.9* 25.0* 26.0* 32.3*  MCV 83.0  --   --  79.4*  PLT 515*  --   --  521*    Basic Metabolic Panel: Recent Labs  Lab 02-26-2021 1319 02/26/21 1324 Feb 26, 2021 1506 02/26/21 1800 02/08/21 0733  NA 130* 130* 132* 129* 134*  K 4.4 4.3 3.6 4.0 5.0  CL 98 96*  --  101 98  CO2 17*  --   --  22 21*  GLUCOSE 183* 185*  --  239* 131*  BUN 6* 6*  --  8 17  CREATININE 1.15* 0.80  --  0.89 1.31*  CALCIUM 7.8*  --   --  6.8* 7.8*  MG  --   --   --   --  1.8  PHOS  --   --   --   --  5.4*   GFR: Estimated Creatinine Clearance: 29.6 mL/min (A) (by C-G formula based on SCr of 1.31 mg/dL (H)). Recent Labs  Lab 2021-03-03 1319 03/03/21 1342 Mar 03, 2021 1541 02/08/21 0733  WBC 15.7*  --   --  33.3*  LATICACIDVEN  --  7.9* 3.2*  --     Liver Function Tests: No results for input(s): AST, ALT, ALKPHOS, BILITOT, PROT, ALBUMIN in the last 168 hours. No results for input(s): LIPASE, AMYLASE in the last 168 hours. Recent Labs  Lab 2021-03-03 1645  AMMONIA 40*    ABG:    Component Value Date/Time   PHART 7.307 (L) 03-03-2021 1506   PCO2ART 54.8 (H) 03-Mar-2021 1506   PO2ART 546 (H) March 03, 2021 1506   HCO3 27.4 03-03-2021 1506   TCO2 29 03-03-2021 1506   O2SAT 100.0 Mar 03, 2021 1506     Coagulation Profile: No results for input(s): INR, PROTIME in the last 168 hours.  Cardiac Enzymes: No results for input(s): CKTOTAL, CKMB, CKMBINDEX, TROPONINI in the last 168 hours.  HbA1C: No results found for: HGBA1C  CBG: Recent Labs  Lab 02/10/21 1726 02/10/21 1941 02/10/21 2321 02/11/21 0021 02/11/21 0334  GLUCAP 112*  82 67* 84 79    This patient is critically ill with multiple organ system failure; which, requires frequent high complexity decision making, assessment, support, evaluation, and titration of therapies. This was completed through the application of advanced monitoring technologies and extensive interpretation of multiple databases. During this encounter critical care time was devoted to patient care services described in this note for 32 minutes.  Josephine Igo, DO [place] [facility] [facility] 02/11/2021 9:46 AM

## 2021-02-12 ENCOUNTER — Inpatient Hospital Stay (HOSPITAL_COMMUNITY): Payer: Medicare Other

## 2021-02-12 DIAGNOSIS — Z66 Do not resuscitate: Secondary | ICD-10-CM

## 2021-02-12 DIAGNOSIS — G40901 Epilepsy, unspecified, not intractable, with status epilepticus: Secondary | ICD-10-CM

## 2021-02-12 DIAGNOSIS — Z515 Encounter for palliative care: Secondary | ICD-10-CM

## 2021-02-12 DIAGNOSIS — L98429 Non-pressure chronic ulcer of back with unspecified severity: Secondary | ICD-10-CM

## 2021-02-12 LAB — CULTURE, BLOOD (ROUTINE X 2)
Culture: NO GROWTH
Culture: NO GROWTH
Special Requests: ADEQUATE

## 2021-02-12 LAB — GLUCOSE, CAPILLARY: Glucose-Capillary: 75 mg/dL (ref 70–99)

## 2021-02-12 NOTE — Progress Notes (Signed)
NAME:  Anna Acosta MRN:  [id number] DOB:  November 25, 1945 LOS: 5 ADMISSION DATE:  02/14/2021 CONSULTATION DATE:  01/18/2021 REFERRING MD:  Myrtis Ser CHIEF COMPLAINT:  Cardiac arrest   History of Present Illness:  75 year old chronically unwell female with PMHx significant for HTN, T2DM, lung cancer (s/p resection), dementia and FTT who presented to [facility] ED 5/24 via EMS s/p cardiac arrest. Patient's son was reportedly caring for her sacral decubitus ulcer when he turned her back over and realized she was not breathing/pulseless. Patient's son immediately started CPR. Initial rhythm on first responder arrival was asystole with completion of CPR x 2 rounds (8 total minutes) and Epi x 1 with ROSC. King airway was placed in the field, exchanged to ETT. Low-dose Levophed was initiated.  On arrival to ED, labs notable for WBC 15K, H&H 8.0/28.9, Plt 515. Na 130, K 4.4, CO2 17, BUN 6, Cr 1.15. Trop 19, LA 7.9. ABG 7.370/54.8/546/27.4. CXR demonstrating masslike opacity in L lung apex. CT Head demonstrating mixed density R subdural collection (acute/chronic) with mild mass effect, slight < 34mm R to L midline shift.  PCCM consulted for admission.  Pertinent Medical History:  HTN, T2DM, dementia, lung CA (s/p resection), FTT  Significant Hospital Events: Including procedures, antibiotic start and stop dates in addition to other pertinent events   . 5/24 - BIB EMS s/p cardiac arrest; CPR x 2 rounds (8 total minutes) and Epi x 1 with ROSC. CT Head with mixed acute/chronic subdural collection with mass effect/slight midline shift. Elevated LA. On low dose pressors.  Interim History / Subjective:   Remains critically ill, intubated On propofol drip for seizures at 40 mics -Per RN when lowered, clinical seizures appear. Off pressors Afebrile Low urine output, +6 L  Objective:  Blood pressure (!) 96/55, pulse (!) 53, temperature 97.9 F (36.6 C), temperature source Oral, resp. rate 18, height 5' 4.02" (1.626  m), weight 50 kg, SpO2 96 %.    Vent Mode: PRVC FiO2 (%):  [30 %] 30 % Set Rate:  [18 bmp] 18 bmp Vt Set:  [430 mL] 430 mL PEEP:  [5 cmH20] 5 cmH20 Plateau Pressure:  [20 cmH20-26 cmH20] 21 cmH20   Intake/Output Summary (Last 24 hours) at 02/12/2021 0840 Last data filed at 02/12/2021 0800 Gross per 24 hour  Intake 829.41 ml  Output 805 ml  Net 24.41 ml   Filed Weights   02/10/21 0400 02/11/21 0500 02/12/21 0255  Weight: 49.3 kg 49.7 kg 50 kg   Physical Examination: General:  [name] HEENT:  Endotracheal tube in place Neuro:  Induced coma on propofol, RASS -3, pupils 2 mm nonreactive to light, CV:  S1-S2 regular PULM:  Bilateral air entry present, no accessory muscle use GI:  Soft, nontender nondistended bowel sounds present Extremities:  1+ edema both arms, right extremity thigh wound Skin: Sacral decubitus per nursing documentation.    Pressure Injury 02/02/2021 Sacrum Anterior;Mid Stage 4 - Full thickness tissue loss with exposed bone, tendon or muscle. 5cmx5cm (Active)  01/17/2021 1820  Location: Sacrum  Location Orientation: Anterior;Mid  Staging: Stage 4 - Full thickness tissue loss with exposed bone, tendon or muscle.  Wound Description (Comments): 5cmx5cm  Present on Admission: Yes     Pressure Injury 02/11/2021 Thigh Posterior;Right Stage 2 -  Partial thickness loss of dermis presenting as a shallow open injury with a red, pink wound bed without slough. (Active)  02/14/2021 1820  Location: Thigh  Location Orientation: Posterior;Right  Staging: Stage  2 -  Partial thickness loss of dermis presenting as a shallow open injury with a red, pink wound bed without slough.  Wound Description (Comments):   Present on Admission:     Labs/imaging that I have personally reviewed: (right click and "Reselect all SmartList Selections" daily)   LTM EEG 5/26 worsening myoclonic seizures nowconsistent with myoclonic status epilepticus  MRI brain  5/29 Symmetric abnormal diffusion and T2/FLAIR signal abnormality involving the basal ganglia, thalami, perirolandic cortices, and cerebellum. Findings consistent with anoxic brain injury Subacute subdural hematoma overlying the right cerebral convexity measuring up to 1.3 cm with associated trace right-to-left shift  Advanced temporal lobe predominant cerebral atrophy   Labs 5/25 showed mild hypokalemia, leukocytosis Chest x-ray left upper lobe lung mass  Resolved Hospital Problem List:     Assessment & Plan:   53 yoF who experienced cardiopulmonary arrest 5/24 while son was delivering care, ROSC achieved after 8 minutes of CPR. She was intubated and started on pressor support. CT head showed acute on chronic right sided subdural hematoma and subsequently found to be having a myoclonic seizure.    Acute cardiopulmonary arrest  -Etiology unclear, consider sepsis.  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  Poor prognosis has been given to family Continue propofol to suppress seizures  Acute hypoxic respiratory failure  Not a candidate for weaning  Severe sepsis -Continue Zosyn x 5-7 ds Neg cultures  AKI Plan: Not a candidate for dialysis due to severe anoxic brain injury Follow bmet  LUE swelling, unilateral -Doppler extremity negative for DVT  Sacral decubitus ulcer -Wound care consult appreciated  History of diabetes -CBGs    History of lung cancer s/p resection Outpatient follow-up if survives  Goals of care: Consult placed to palliative care Currently DNR 5/28 poor prognosis given to family, older son who is from [place] in agreement with withdrawal of life support but you understand with whom she lives not quite there yet.  We will give family some more time to come to terms with this situation  Best Practice: (right click and "Reselect all SmartList  Selections" daily)  Diet:  NPO Pain/Anxiety/Delirium protocol (if indicated): Yes (RASS goal 0) VAP protocol (if indicated): Yes DVT prophylaxis: Contraindicated GI prophylaxis: PPI Glucose control:  SSI No Central venous access:  N/A Arterial line:  N/A Foley:  Yes, and it is still needed Mobility:  bed rest  PT consulted: N/A Last date of multidisciplinary goals of care discussion [5/28] Code Status:  DNR Disposition: ICU Family:   Labs:   CBC: Recent Labs  Lab 01/19/2021 1319 02/01/2021 1324 02/06/2021 1506 02/08/21 0733  WBC 15.7*  --   --  33.3*  HGB 8.0* 8.5* 8.8* 9.6*  HCT 28.9* 25.0* 26.0* 32.3*  MCV 83.0  --   --  79.4*  PLT 515*  --   --  521*    Basic Metabolic Panel: Recent Labs  Lab 02/14/2021 1319 02/08/2021 1324 01/20/2021 1506 01/15/2021 1800 02/08/21 0733  NA 130* 130* 132* 129* 134*  K 4.4 4.3 3.6 4.0 5.0  CL 98 96*  --  101 98  CO2 17*  --   --  22 21*  GLUCOSE 183* 185*  --  239* 131*  BUN 6* 6*  --  8 17  CREATININE 1.15* 0.80  --  0.89 1.31*  CALCIUM 7.8*  --   --  6.8* 7.8*  MG  --   --   --   --  1.8  PHOS  --   --   --   --  5.4*   GFR: Estimated Creatinine Clearance: 29.7 mL/min (A) (by C-G formula based on SCr of 1.31 mg/dL (H)). Recent Labs  Lab 01/24/2021 1319 01/31/2021 1342 01/27/2021 1541 02/08/21 0733  WBC 15.7*  --   --  33.3*  LATICACIDVEN  --  7.9* 3.2*  --     Liver Function Tests: No results for input(s): AST, ALT, ALKPHOS, BILITOT, PROT, ALBUMIN in the last 168 hours. No results for input(s): LIPASE, AMYLASE in the last 168 hours. Recent Labs  Lab 02/06/2021 1645  AMMONIA 40*    ABG:    Component Value Date/Time   PHART 7.307 (L) 02/09/2021 1506   PCO2ART 54.8 (H) 02/10/2021 1506   PO2ART 546 (H) 01/31/2021 1506   HCO3 27.4 01/17/2021 1506   TCO2 29 01/27/2021 1506   O2SAT 100.0 02/01/2021 1506     Coagulation Profile: No results for input(s): INR, PROTIME in the last 168 hours.  Cardiac Enzymes: No results for  input(s): CKTOTAL, CKMB, CKMBINDEX, TROPONINI in the last 168 hours.  HbA1C: No results found for: HGBA1C  CBG: Recent Labs  Lab 02/10/21 1941 02/10/21 2321 02/11/21 0021 02/11/21 0334 02/12/21 0519  GLUCAP 82 67* 84 79 75     Patient critically ill due to shock, seizures, anoxia, respiratory failure Interventions to address this today pressors, vent, goals of care discussions Risk of deterioration without these interventions is high  I personally spent 32 minutes providing critical care not including any separately billable procedures  Cyril Mourning MD. FCCP. [facility] Pager : [phone]  If no response to pager , please call [phone] until 7 pm After 7:00 pm call Elink  [phone]    02/12/2021 8:40 AM

## 2021-02-12 NOTE — Consult Note (Signed)
Consultation Note Date: 02/12/2021   Patient Name: Anna Acosta  DOB: 12-04-1945  MRN: [id number]  Age / Sex: 75 y.o., female  PCP: Default, Provider, MD Referring Physician: Garner Nash, DO  Reason for Consultation: Establishing goals of care  HPI/Patient Profile: 75 y.o. female  admitted on 02/05/2021 with PMH of hypertension, diabetes, severe dementia, prior lung cancer with an apparent left upper lobe resection, prior subdural hematoma (per her son's history).    She has failure to thrive has been largely dependent for her care, bedbound and has a large deep sacral decubitus ulcer.   Cared for in the home by son/Steven  She experienced cardiopulmonary arrest 5/24 while her son was delivering care.  He immediately started CPR.  She was in asystole when telemetry placed, 8 more minutes of CPR.  She had a King airway that was exchanged to an ET tube when she arrived to the emergency department.    MR Brain 02-12-21 IMPRESSION: 1. Symmetric abnormal diffusion and T2/FLAIR signal abnormality involving the basal ganglia, thalami, perirolandic cortices, and cerebellum. Findings consistent with anoxic brain injury. No significant mass effect. 2. Subacute subdural hematoma overlying the right cerebral convexity measuring up to 1.3 cm with associated trace right-to-left shift. 3. Advanced temporal lobe predominant cerebral atrophy with underlying chronic microvascular ischemic disease. 4. Loss of normal flow void within the left V4 segment, likely [place].  Long term poor prognosis  Family face treatment options decisions, advanced directive decisions and anticipatory care needs.    Clinical Assessment and Goals of Care:   This NP Wadie Lessen reviewed medical records, received report from team, assessed the patient and then meet at the patient's bedside along with her son [name] # [phone]   to discuss diagnosis, prognosis, GOC, EOL wishes disposition and options.   Concept of Palliative Care was introduced as specialized medical care for people and their families living with serious illness.  If focuses on providing relief from the symptoms and stress of a serious illness.  The goal is to improve quality of life for both the patient and the family. Values and goals of care important to patient and family were attempted to be elicited.  Created space and opportunity for her son  to explore thoughts and feelings regarding current medical situation.     A  discussion was had today regarding advanced directives.  Concepts specific to code status, artifical feeding and hydration, continued IV antibiotics and rehospitalization was had.  The difference between a aggressive medical intervention path  and a palliative comfort care path for this patient at this time was had.       Natural trajectory and expectations at EOL were discussed.  Education offered on the patient's current bradycardia.  Prepared son at bedside that anything could happen at any time and that patient may actually expire on the ventilator with current medical support.  He verbalizes understanding.  He hopes to involve his brother [name] in plan of care discussions for this patient, however he has been reluctant     Questions and concerns addressed.  Patient  encouraged to call with questions or concerns.     PMT will continue to support holistically.  Encouraged family to call me/ left contact information when they arrive to the bedside in the morning for continued conversation regarding goals of care        No documented H POA or advanced care planning documents.  Patient's  has 2 sons who legally will act together in  making  medical decisions for this patient.    SUMMARY OF RECOMMENDATIONS    Code Status/Advance Care Planning:  DNR    Palliative Prophylaxis:   Aspiration, Bowel Regimen, Delirium  Protocol, Frequent Pain Assessment and Oral Care    Prognosis:   Poor prognosis-will be impacted by family decision for life prolonging measures.  Discharge Planning: Anticipated Hospital Death      Primary Diagnoses: Present on Admission: . Cardiac arrest ([place])   I have reviewed the medical record, interviewed the patient and family, and examined the patient. The following aspects are pertinent.  Past Medical History:  Diagnosis Date  . Dementia ([place])   . Diabetes mellitus without complication ([place])   . Hypertension    Social History   Socioeconomic History  . Marital status: Single    Spouse name: Not on file  . Number of children: Not on file  . Years of education: Not on file  . Highest education level: Not on file  Occupational History  . Not on file  Tobacco Use  . Smoking status: Smoker, Current Status Unknown  . Smokeless tobacco: Not on file  Vaping Use  . Vaping Use: Unknown  Substance and Sexual Activity  . Alcohol use: Not Currently  . Drug use: Not Currently  . Sexual activity: Not Currently  Other Topics Concern  . Not on file  Social History Narrative  . Not on file   Social Determinants of Health   Financial Resource Strain: Not on file  Food Insecurity: Not on file  Transportation Needs: Not on file  Physical Activity: Not on file  Stress: Not on file  Social Connections: Not on file   History reviewed. No pertinent family history. Scheduled Meds: . chlorhexidine gluconate (MEDLINE KIT)  15 mL Mouth Rinse BID  . Chlorhexidine Gluconate Cloth  6 each Topical Daily  . Gerhardt's butt cream   Topical TID  . mouth rinse  15 mL Mouth Rinse 10 times per day  . pantoprazole sodium  40 mg Per Tube Daily   Continuous Infusions: . sodium chloride 10 mL/hr at 02/08/21 1900  . levETIRAcetam 1,000 mg (02/12/21 0941)  . piperacillin-tazobactam (ZOSYN)  IV 12.5 mL/hr at 02/12/21 0900  . propofol (DIPRIVAN) infusion 20 mcg/kg/min (02/12/21 0900)   . valproate sodium Stopped (02/11/21 2337)   PRN Meds:.docusate, fentaNYL (SUBLIMAZE) injection, fentaNYL (SUBLIMAZE) injection, polyethylene glycol Medications Prior to Admission:  Prior to Admission medications   Not on File   Not on File Review of Systems  Unable to perform ROS: Intubated    Physical Exam Constitutional:      Appearance: She is ill-appearing.     Interventions: She is intubated.  Cardiovascular:     Rate and Rhythm: Bradycardia present.  Pulmonary:     Effort: She is intubated.  Skin:    General: Skin is warm and dry.     Vital Signs: BP 105/62   Pulse (!) 51   Temp 97.9 F (36.6 C) (Oral)   Resp 18   Ht 5' 4.02" (1.626 m)   Wt 50 kg   SpO2 100%   BMI 18.91 kg/m  Pain Scale: CPOT       SpO2: SpO2: 100 % O2 Device:SpO2: 100 % O2 Flow Rate: .   IO: Intake/output summary:   Intake/Output Summary (Last 24 hours) at 02/12/2021 1007 Last data filed at 02/12/2021 0900 Gross per 24 hour  Intake 784.2 ml  Output 805 ml  Net -20.8 ml  LBM: Last BM Date: 02/08/21 Baseline Weight: Weight: 60 kg Most recent weight: Weight: 50 kg     Palliative Assessment/Data:   Discussed with Dr Elsworth Soho  Time In: 1330 Time Out: 1440 Time Total: 70 minutes Greater than 50%  of this time was spent counseling and coordinating care related to the above assessment and plan.  Signed by: Wadie Lessen, NP   Please contact Palliative Medicine Team phone at [phone] for questions and concerns.  For individual provider: See Shea Evans

## 2021-02-12 NOTE — Progress Notes (Signed)
Pt transported to MRI and back to 70M with no complications

## 2021-02-13 DIAGNOSIS — G931 Anoxic brain damage, not elsewhere classified: Secondary | ICD-10-CM

## 2021-02-13 DIAGNOSIS — G40901 Epilepsy, unspecified, not intractable, with status epilepticus: Secondary | ICD-10-CM

## 2021-02-13 LAB — GLUCOSE, CAPILLARY
Glucose-Capillary: 58 mg/dL — ABNORMAL LOW (ref 70–99)
Glucose-Capillary: 57 mg/dL — ABNORMAL LOW (ref 70–99)
Glucose-Capillary: 59 mg/dL — ABNORMAL LOW (ref 70–99)
Glucose-Capillary: 61 mg/dL — ABNORMAL LOW (ref 70–99)
Glucose-Capillary: 161 mg/dL — ABNORMAL HIGH (ref 70–99)
Glucose-Capillary: 60 mg/dL — ABNORMAL LOW (ref 70–99)
Glucose-Capillary: 131 mg/dL — ABNORMAL HIGH (ref 70–99)
Glucose-Capillary: 80 mg/dL (ref 70–99)
Glucose-Capillary: 89 mg/dL (ref 70–99)
Glucose-Capillary: 101 mg/dL — ABNORMAL HIGH (ref 70–99)

## 2021-02-13 LAB — CBC
HCT: 21.7 % — ABNORMAL LOW (ref 36.0–46.0)
Hemoglobin: 7 g/dL — ABNORMAL LOW (ref 12.0–15.0)
MCH: 23 pg — ABNORMAL LOW (ref 26.0–34.0)
MCHC: 32.3 g/dL (ref 30.0–36.0)
MCV: 71.1 fL — ABNORMAL LOW (ref 80.0–100.0)
Platelets: 199 10*3/uL (ref 150–400)
RBC: 3.05 MIL/uL — ABNORMAL LOW (ref 3.87–5.11)
RDW: 18.3 % — ABNORMAL HIGH (ref 11.5–15.5)
WBC: 2.7 10*3/uL — ABNORMAL LOW (ref 4.0–10.5)
nRBC: 0 % (ref 0.0–0.2)

## 2021-02-13 LAB — BASIC METABOLIC PANEL
Anion gap: 10 (ref 5–15)
BUN: 26 mg/dL — ABNORMAL HIGH (ref 8–23)
CO2: 23 mmol/L (ref 22–32)
Calcium: 7.2 mg/dL — ABNORMAL LOW (ref 8.9–10.3)
Chloride: 104 mmol/L (ref 98–111)
Creatinine, Ser: 1.48 mg/dL — ABNORMAL HIGH (ref 0.44–1.00)
GFR, Estimated: 37 mL/min — ABNORMAL LOW (ref >60)
Glucose, Bld: 68 mg/dL — ABNORMAL LOW (ref 70–99)
Potassium: 2.3 mmol/L — CL (ref 3.5–5.1)
Sodium: 137 mmol/L (ref 135–145)

## 2021-02-13 LAB — PHOSPHORUS
Phosphorus: 3.8 mg/dL (ref 2.5–4.6)
Phosphorus: 3.6 mg/dL (ref 2.5–4.6)

## 2021-02-13 LAB — MAGNESIUM
Magnesium: 2 mg/dL (ref 1.7–2.4)
Magnesium: 1.9 mg/dL (ref 1.7–2.4)

## 2021-02-13 MED ORDER — POTASSIUM CHLORIDE 20 MEQ PO PACK
40.0000 meq | PACK | Freq: Once | ORAL | Status: AC
  Administered 2021-02-13: 40 meq
  Filled 2021-02-13: qty 2

## 2021-02-13 MED ORDER — ENOXAPARIN SODIUM 30 MG/0.3ML IJ SOSY
30.0000 mg | PREFILLED_SYRINGE | INTRAMUSCULAR | Status: DC
  Administered 2021-02-13 – 2021-02-17 (×5): 30 mg via SUBCUTANEOUS
  Filled 2021-02-13 (×5): qty 0.3

## 2021-02-13 MED ORDER — POTASSIUM CHLORIDE 10 MEQ/100ML IV SOLN
10.0000 meq | INTRAVENOUS | Status: AC
  Administered 2021-02-13 (×8): 10 meq via INTRAVENOUS
  Filled 2021-02-13 (×8): qty 100

## 2021-02-13 MED ORDER — VITAL HIGH PROTEIN PO LIQD
1000.0000 mL | ORAL | Status: DC
  Administered 2021-02-13: 1000 mL

## 2021-02-13 MED ORDER — ADULT MULTIVITAMIN W/MINERALS CH
1.0000 | ORAL_TABLET | Freq: Every day | ORAL | Status: DC
  Administered 2021-02-13 – 2021-02-24 (×12): 1
  Filled 2021-02-13 (×12): qty 1

## 2021-02-13 MED ORDER — JUVEN PO PACK
1.0000 | PACK | Freq: Two times a day (BID) | ORAL | Status: DC
  Administered 2021-02-13 – 2021-02-24 (×22): 1
  Filled 2021-02-13 (×22): qty 1

## 2021-02-13 MED ORDER — VITAL AF 1.2 CAL PO LIQD
1000.0000 mL | ORAL | Status: DC
  Administered 2021-02-13 – 2021-02-24 (×13): 1000 mL
  Filled 2021-02-13 (×8): qty 1000

## 2021-02-13 MED ORDER — DEXTROSE 50 % IV SOLN
INTRAVENOUS | Status: AC
  Administered 2021-02-13: 50 mL
  Filled 2021-02-13: qty 50

## 2021-02-13 NOTE — Progress Notes (Addendum)
NAME:  Anna Acosta MRN:  [id number] DOB:  [birth year] LOS: 6 ADMISSION DATE:  02/06/2021 CONSULTATION DATE:  01/20/2021 REFERRING MD:  Myrtis Ser CHIEF COMPLAINT:  Cardiac arrest   History of Present Illness:  75 year old chronically unwell female with PMHx significant for HTN, T2DM, lung cancer (s/p resection), dementia and FTT who presented to [facility] ED 5/24 via EMS s/p cardiac arrest. Patient's son was reportedly caring for her sacral decubitus ulcer when he turned her back over and realized she was not breathing/pulseless. Patient's son immediately started CPR. Initial rhythm on first responder arrival was asystole with completion of CPR x 2 rounds (8 total minutes) and Epi x 1 with ROSC. King airway was placed in the field, exchanged to ETT. Low-dose Levophed was initiated.  On arrival to ED, labs notable for WBC 15K, H&H 8.0/28.9, Plt 515. Na 130, K 4.4, CO2 17, BUN 6, Cr 1.15. Trop 19, LA 7.9. ABG 7.370/54.8/546/27.4. CXR demonstrating masslike opacity in L lung apex. CT Head demonstrating mixed density R subdural collection (acute/chronic) with mild mass effect, slight < 47mm R to L midline shift.  PCCM consulted for admission.  Pertinent Medical History:  HTN, T2DM, dementia, lung CA (s/p resection), FTT  Significant Hospital Events: Including procedures, antibiotic start and stop dates in addition to other pertinent events   . 5/24 - BIB EMS s/p cardiac arrest; CPR x 2 rounds (8 total minutes) and Epi x 1 with ROSC. CT Head with mixed acute/chronic subdural collection with mass effect/slight midline shift. Elevated LA. On low dose pressors. . 5/29 off pressors  Interim History / Subjective:   Remains critically ill, intubated Propofol was turned off but then restarted this morning since twitching noted. Hypoglycemic this morning Urine output remains low  Objective:  Blood pressure 132/71, pulse 65, temperature (!) 96.1 F (35.6 C), temperature source Axillary, resp. rate 18, height  5' 4.02" (1.626 m), weight 50 kg, SpO2 100 %.    Vent Mode: PRVC FiO2 (%):  [30 %] 30 % Set Rate:  [18 bmp] 18 bmp Vt Set:  [430 mL] 430 mL PEEP:  [5 cmH20] 5 cmH20 Plateau Pressure:  [18 cmH20-21 cmH20] 21 cmH20   Intake/Output Summary (Last 24 hours) at 02/13/2021 1141 Last data filed at 02/13/2021 1000 Gross per 24 hour  Intake 1125.37 ml  Output 120 ml  Net 1005.37 ml   Filed Weights   02/11/21 0500 02/12/21 0255 02/13/21 0408  Weight: 49.7 kg 50 kg 50 kg   Physical Examination: General:  [name] HEENT:  Endotracheal tube in place Neuro:  Unresponsive, off propofol, does not follow commands pupils 2 mm bilaterally equally reactive to light, minimal twitching noted right lip CV:  S1-S2 regular PULM:  No accessory muscle use, bilateral air entry present GI:  Soft, nontender nondistended bowel sounds present Extremities:  1+ edema both arms, right extremity thigh wound Skin: Sacral decubitus per nursing documentation.    Pressure Injury 02/09/2021 Sacrum Anterior;Mid Stage 4 - Full thickness tissue loss with exposed bone, tendon or muscle. 5cmx5cm (Active)  01/25/2021 1820  Location: Sacrum  Location Orientation: Anterior;Mid  Staging: Stage 4 - Full thickness tissue loss with exposed bone, tendon or muscle.  Wound Description (Comments): 5cmx5cm  Present on Admission: Yes     Pressure Injury 01/30/2021 Thigh Posterior;Right Stage 2 -  Partial thickness loss of dermis presenting as a shallow open injury with a red, pink wound bed without slough. (Active)  02/05/2021 1820  Location: Thigh  Location Orientation: Posterior;Right  Staging: Stage 2 -  Partial thickness loss of dermis presenting as a shallow open injury with a red, pink wound bed without slough.  Wound Description (Comments):   Present on Admission:     Labs/imaging that I have personally reviewed: (right click and "Reselect all SmartList Selections" daily)   LTM EEG 5/26  worsening myoclonic seizures nowconsistent with myoclonic status epilepticus  MRI brain 5/29 Symmetric abnormal diffusion and T2/FLAIR signal abnormality involving the basal ganglia, thalami, perirolandic cortices, and cerebellum. Findings consistent with anoxic brain injury Subacute subdural hematoma overlying the right cerebral convexity measuring up to 1.3 cm with associated trace right-to-left shift  Advanced temporal lobe predominant cerebral atrophy   Labs 5/30 show severe hypokalemia, slight increase in creatinine, leukopenia, and anemia Chest x-ray left upper lobe lung mass  Resolved Hospital Problem List:     Assessment & Plan:   5 yoF who experienced cardiopulmonary arrest 5/24 while son was delivering care, ROSC achieved after 8 minutes of CPR. She was intubated and started on pressor support. CT head showed acute on chronic right sided subdural hematoma and subsequently found to be having a myoclonic seizure.    Acute cardiopulmonary arrest  -Etiology unclear, consider sepsis.  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 Plan for Lasix tomorrow once potassium repleted  LUE swelling, unilateral -Doppler extremity negative for DVT  Sacral decubitus ulcer -Wound care consult appreciated  History of diabetes -CBGs    History of lung cancer s/p resection Outpatient follow-up if survives  Goals of care: Consult placed to palliative care Currently DNR 5/28 poor prognosis given  to family, older son who is from [place] in agreement with withdrawal of life support   We will give other son some more time to come to terms with this situation.  He has not come to visit as of 5/30 am  Best Practice: (right click and "Reselect all SmartList Selections" daily)  Diet:  Tube Feed  Pain/Anxiety/Delirium protocol (if indicated): Yes (RASS goal 0) VAP protocol (if indicated): Yes DVT prophylaxis: Contraindicated GI prophylaxis: PPI Glucose control:  SSI No Central venous access:  N/A Arterial line:  N/A Foley:  Yes, and it is still needed Mobility:  bed rest  PT consulted: N/A Last date of multidisciplinary goals of care discussion [5/28] Code Status:  DNR Disposition: ICU Family: son  Labs:   CBC: Recent Labs  Lab 02/10/2021 1319 01/17/2021 1324 01/20/2021 1506 02/08/21 0733 02/13/21 0230  WBC 15.7*  --   --  33.3* 2.7*  HGB 8.0* 8.5* 8.8* 9.6* 7.0*  HCT 28.9* 25.0* 26.0* 32.3* 21.7*  MCV 83.0  --   --  79.4* 71.1*  PLT 515*  --   --  521* 199    Basic Metabolic Panel: Recent Labs  Lab 01/25/2021 1319 01/16/2021 1324 02/08/2021 1506 01/15/2021 1800 02/08/21 0733 02/13/21 0230  NA 130* 130* 132* 129* 134* 137  K 4.4 4.3 3.6 4.0 5.0 2.3*  CL 98 96*  --  101 98 104  CO2 17*  --   --  22 21* 23  GLUCOSE 183* 185*  --  239* 131* 68*  BUN 6* 6*  --  8 17 26*  CREATININE 1.15* 0.80  --  0.89 1.31* 1.48*  CALCIUM 7.8*  --   --  6.8* 7.8* 7.2*  MG  --   --   --   --  1.8 2.0  PHOS  --   --   --   --  5.4*  --    GFR: Estimated Creatinine Clearance: 26.3 mL/min (A) (by C-G formula based on SCr of 1.48 mg/dL (H)). Recent Labs  Lab 02-23-21 1319 02-23-2021 1342 Feb 23, 2021 1541 02/08/21 0733 02/13/21 0230  WBC 15.7*  --   --  33.3* 2.7*  LATICACIDVEN  --  7.9* 3.2*  --   --     Liver Function Tests: No results for input(s): AST, ALT, ALKPHOS, BILITOT, PROT, ALBUMIN in the last 168 hours. No results for input(s): LIPASE, AMYLASE in the last 168 hours. Recent Labs   Lab 02-23-21 1645  AMMONIA 40*    ABG:    Component Value Date/Time   PHART 7.307 (L) 02-23-21 1506   PCO2ART 54.8 (H) February 23, 2021 1506   PO2ART 546 (H) 02/23/2021 1506   HCO3 27.4 23-Feb-2021 1506   TCO2 29 Feb 23, 2021 1506   O2SAT 100.0 2021-02-23 1506     Coagulation Profile: No results for input(s): INR, PROTIME in the last 168 hours.  Cardiac Enzymes: No results for input(s): CKTOTAL, CKMB, CKMBINDEX, TROPONINI in the last 168 hours.  HbA1C: No results found for: HGBA1C  CBG: Recent Labs  Lab 02/13/21 0831 02/13/21 0833 02/13/21 0929 02/13/21 0931 02/13/21 1007  GLUCAP 57* 59* 61* 60* 161*    My independent critical care time x 64m    Cyril Mourning MD. FCCP. [facility] Pager : [phone]  If no response to pager , please call [phone] until 7 pm After 7:00 pm call Elink  [phone]    02/13/2021 11:41 AM

## 2021-02-13 NOTE — Progress Notes (Signed)
Hypoglycemic Event  CBG: 59  Treatment: 8 oz juice/soda  Symptoms: None  Follow-up CBG: Time:0931 CBG Result:61  Possible Reasons for Event: Inadequate meal intake   Electa Sniff, Drew Lips P

## 2021-02-13 NOTE — Progress Notes (Signed)
eLink Physician-Brief Progress Note Patient Name: Anna Acosta DOB: [birth year] MRN: [id number]   Date of Service  02/13/2021  HPI/Events of Note  AM K+ 2.3 with crea 1.48 and GFR 37 Hgb 7.0 with Hct 21.7.However per Dr Vassie Loll note DNR  eICU Interventions  Escalation of care is still not clear, will leave to AM team. - K replacement protocol ordered No PRBC yet. No active bleeding.      Intervention Category Intermediate Interventions: Electrolyte abnormality - evaluation and management  Ranee Gosselin 02/13/2021, 5:02 AM

## 2021-02-13 NOTE — Progress Notes (Signed)
Hypoglycemic Event  CBG: 58  Treatment: 4 oz juice/soda  Symptoms: None  Follow-up CBG: [id number] CBG Result:59  Possible Reasons for Event: Inadequate meal intake  Comments:  Tube feedings and chocolate milk given     Karrie Doffing P

## 2021-02-13 NOTE — Progress Notes (Signed)
Hypoglycemic Event  CBG: 61 Treatment: D50 50 mL (25 gm)  Symptoms: None  Follow-up CBG: Time:1007 CBG Result:161  Possible Reasons for Event: Inadequate meal intake  Electa Sniff, Sagan Maselli P

## 2021-02-13 NOTE — Progress Notes (Addendum)
Subjective: continues to have sz  ROS: Unable to obtain due to poor mental status  Examination  Vital signs in last 24 hours: Temp:  [87.2 F (30.7 C)-96.4 F (35.8 C)] 96.1 F (35.6 C) (05/30 0800) Pulse Rate:  [39-83] 65 (05/30 1000) Resp:  [16-18] 18 (05/30 1000) BP: (94-135)/(50-86) 132/71 (05/30 1000) SpO2:  [96 %-100 %] 100 % (05/30 1000) FiO2 (%):  [30 %] 30 % (05/30 1000) Weight:  [50 kg] 50 kg (05/30 0408)  General: lying in bed,myoclonic seizures everyfewseconds CVS: pulse-normal rate and rhythm ZO:[id number], initiating breaths on vent Extremities:Warm, edematous Neuro:[name], does not open eyes to noxious stimuli, pupils equal and reacting to light,, corneal reflex absent, gag reflex absent, withdraws to noxious stimuli in all 4 extremities, myoclonic seizures predominate involving right side of face after stimulation  Basic Metabolic Panel: Recent Labs  Lab 01/17/2021 1319 02/13/2021 1324 02/09/2021 1506 01/22/2021 1800 02/08/21 0733 02/13/21 0230  NA 130* 130* 132* 129* 134* 137  K 4.4 4.3 3.6 4.0 5.0 2.3*  CL 98 96*  --  101 98 104  CO2 17*  --   --  22 21* 23  GLUCOSE 183* 185*  --  239* 131* 68*  BUN 6* 6*  --  8 17 26*  CREATININE 1.15* 0.80  --  0.89 1.31* 1.48*  CALCIUM 7.8*  --   --  6.8* 7.8* 7.2*  MG  --   --   --   --  1.8 2.0  PHOS  --   --   --   --  5.4*  --     CBC: Recent Labs  Lab 01/30/2021 1319 02/10/2021 1324 01/18/2021 1506 02/08/21 0733 02/13/21 0230  WBC 15.7*  --   --  33.3* 2.7*  HGB 8.0* 8.5* 8.8* 9.6* 7.0*  HCT 28.9* 25.0* 26.0* 32.3* 21.7*  MCV 83.0  --   --  79.4* 71.1*  PLT 515*  --   --  521* 199     Coagulation Studies: No results for input(s): LABPROT, INR in the last 72 hours.  Imaging  MRI brain without contrast 02/12/2021:  1.Symmetric abnormal diffusion and T2/FLAIR signal abnormality involving the basal ganglia, thalami, perirolandic cortices, and cerebellum. Findings consistent with anoxic brain injury.  No significant mass effect. 2. Subacute subdural hematoma overlying the right cerebral convexity measuring up to 1.3 cm with associated trace right-to-left shift. 3. Advanced temporal lobe predominant cerebral atrophy with underlying chronic microvascular ischemic disease. 4. Loss of normal flow void within the left V4 segment, likely Occluded.      ASSESSMENT AND PLAN: 75 year old female with past medical history of hypertension, diabetes, dementia, prior lung cancer, prior subdural hematoma, failure to thrive, sacral ulcer who presented after cardiac arrest with about 8 minutes of CPR before ROSC. Patient was noted to have myoclonic seizures which have been resistant to Keppra and Depakote.   Suspected anoxic brain injury Myoclonic seizures -Patient continues to have myoclonic seizures after stimulation and if propofol is lowered  Recommendations -Continue to titrate propofol for suppression of clinical myoclonic seizures -Continue Keppra and Depakote at current dose -Status myoclonus in the first 24 hours after cardiac arrest is suggestive of significant neurologic injury and poor recovery. Given patient's poor prior baseline, I suspect patient has suffered irreversible neurologic injury and has minimal to no chances of meaningful neurologic recovery. -Continue goals of care discussion with family -Management of rest of comorbidities per primary team  I have spent a total of25  minuteswith  the patient reviewing hospitalnotes,  test results, labs and examining the patient as well as establishing an assessment and plan.>50% of time was spent in direct patient care.  Lindie Spruce Epilepsy [facility] For questions after 5pm please refer to AMION to reach the Neurologist on call

## 2021-02-13 NOTE — Progress Notes (Signed)
Nutrition Follow-up  DOCUMENTATION CODES:   Severe malnutrition in context of chronic illness  INTERVENTION:   D/C Vital High Protein  Vital AF 1.2 at 50 ml/h (1200 ml per day)  Provides 1440 kcal, 90 gm protein, 973 ml free water daily TF regimen and propofol at current rate providing 1651 total kcal/day   MVI with minerals daily via tube  -1 packet Juven BID, each packet provides 95 calories, 2.5 grams of protein (collagen), and 9.8 grams of carbohydrate (3 grams sugar); also contains 7 grams of L-arginine and L-glutamine, 300 mg vitamin C, 15 mg vitamin E, 1.2 mcg vitamin B-12, 9.5 mg zinc, 200 mg calcium, and 1.5 g  Calcium Beta-hydroxy-Beta-methylbutyrate to support wound healing   NUTRITION DIAGNOSIS:   Severe Malnutrition related to chronic illness (dementia) as evidenced by severe muscle depletion,severe fat depletion. Ongoing.   GOAL:   Patient will meet greater than or equal to 90% of their needs Progressing with initiation of TF.   MONITOR:   Vent status,Labs,Weight trends,Skin,I & O's  REASON FOR ASSESSMENT:   Consult Enteral/tube feeding initiation and management  ASSESSMENT:   75 year old female who presented to the ED on 5/24 after a witnessed cardiac arrest. PMH of HTN, T2DM, dementia, prior lung cancer s/p LUL resection, prior SDH, sacral decubitus ulcer. Pt was intubated in the field.  Per MD notes pt with poor prognosis, palliative following Consult to RD to start TF, spoke with MD ok to advance TF.  Noted pt with low blood sugars requiring treatment Weight has increased from 104 lb to 110 lb with deep pitting edema   Patient is currently intubated on ventilator support MV: 7.6 L/min Temp (24hrs), Avg:93.2 F (34 C), Min:87.2 F (30.7 C), Max:96.4 F (35.8 C)  Propofol: 8 ml/hr provides: 211 kcal  Medications reviewed and include: protonix keppra KCl 10 mEq x 10 runs depacon  Labs reviewed: K+ 2.3 CBG's: 57-161  OG tube; tip and side  port distal to GE junction per chest xray 5/24     Diet Order:   Diet Order            Diet NPO time specified  Diet effective now                 EDUCATION NEEDS:   No education needs have been identified at this time  Skin:  Skin Assessment: Skin Integrity Issues: Skin Integrity Issues:: Stage II,Stage IV Stage II: R thigh Stage IV: sacrum  Last BM:  5/25  Height:   Ht Readings from Last 1 Encounters:  02/13/2021 5' 4.02" (1.626 m)    Weight:   Wt Readings from Last 1 Encounters:  02/13/21 50 kg    BMI:  Body mass index is 18.91 kg/m.  Estimated Nutritional Needs:   Kcal:  1400-1600  Protein:  75-90 grams  Fluid:  >1.5 L/day  Cammy Copa., RD, LDN, CNSC See AMiON for contact information

## 2021-02-14 DIAGNOSIS — N179 Acute kidney failure, unspecified: Secondary | ICD-10-CM

## 2021-02-14 DIAGNOSIS — J9601 Acute respiratory failure with hypoxia: Secondary | ICD-10-CM

## 2021-02-14 LAB — PREPARE RBC (CROSSMATCH)

## 2021-02-14 LAB — CBC
HCT: 21.8 % — ABNORMAL LOW (ref 36.0–46.0)
Hemoglobin: 6.8 g/dL — CL (ref 12.0–15.0)
MCH: 23.1 pg — ABNORMAL LOW (ref 26.0–34.0)
MCHC: 31.2 g/dL (ref 30.0–36.0)
MCV: 73.9 fL — ABNORMAL LOW (ref 80.0–100.0)
Platelets: 200 10*3/uL (ref 150–400)
RBC: 2.95 MIL/uL — ABNORMAL LOW (ref 3.87–5.11)
RDW: 18.9 % — ABNORMAL HIGH (ref 11.5–15.5)
WBC: 6 10*3/uL (ref 4.0–10.5)
nRBC: 0 % (ref 0.0–0.2)

## 2021-02-14 LAB — BASIC METABOLIC PANEL
Anion gap: 15 (ref 5–15)
BUN: 34 mg/dL — ABNORMAL HIGH (ref 8–23)
CO2: 21 mmol/L — ABNORMAL LOW (ref 22–32)
Calcium: 7.3 mg/dL — ABNORMAL LOW (ref 8.9–10.3)
Chloride: 100 mmol/L (ref 98–111)
Creatinine, Ser: 2.22 mg/dL — ABNORMAL HIGH (ref 0.44–1.00)
GFR, Estimated: 23 mL/min — ABNORMAL LOW (ref >60)
Glucose, Bld: 126 mg/dL — ABNORMAL HIGH (ref 70–99)
Potassium: 3.8 mmol/L (ref 3.5–5.1)
Sodium: 136 mmol/L (ref 135–145)

## 2021-02-14 LAB — MAGNESIUM: Magnesium: 2 mg/dL (ref 1.7–2.4)

## 2021-02-14 LAB — GLUCOSE, CAPILLARY
Glucose-Capillary: 110 mg/dL — ABNORMAL HIGH (ref 70–99)
Glucose-Capillary: 115 mg/dL — ABNORMAL HIGH (ref 70–99)
Glucose-Capillary: 124 mg/dL — ABNORMAL HIGH (ref 70–99)
Glucose-Capillary: 126 mg/dL — ABNORMAL HIGH (ref 70–99)
Glucose-Capillary: 131 mg/dL — ABNORMAL HIGH (ref 70–99)
Glucose-Capillary: 136 mg/dL — ABNORMAL HIGH (ref 70–99)
Glucose-Capillary: 64 mg/dL — ABNORMAL LOW (ref 70–99)

## 2021-02-14 LAB — TRIGLYCERIDES: Triglycerides: 152 mg/dL — ABNORMAL HIGH (ref <150)

## 2021-02-14 LAB — ABO/RH: ABO/RH(D): B POS

## 2021-02-14 LAB — PHOSPHORUS: Phosphorus: 3.2 mg/dL (ref 2.5–4.6)

## 2021-02-14 MED ORDER — SODIUM CHLORIDE 0.9% IV SOLUTION: Freq: Once | INTRAVENOUS | Status: AC

## 2021-02-14 MED ORDER — MIDAZOLAM HCL 2 MG/2ML IJ SOLN
1.0000 mg | INTRAMUSCULAR | Status: DC | PRN
  Administered 2021-02-14 (×2): 1 mg via INTRAVENOUS
  Administered 2021-02-16 – 2021-02-22 (×7): 2 mg via INTRAVENOUS
  Filled 2021-02-14 (×8): qty 2

## 2021-02-14 NOTE — Progress Notes (Addendum)
NAME:  Anna Acosta MRN:  [id number] DOB:  1945/10/21 LOS: 7 ADMISSION DATE:  02/10/2021 CONSULTATION DATE:  01/19/2021 REFERRING MD:  Myrtis Ser CHIEF COMPLAINT:  Cardiac arrest   History of Present Illness:  75 year old chronically unwell female with PMHx significant for HTN, T2DM, lung cancer (s/p resection), dementia and FTT who presented to [facility] ED 5/24 via EMS s/p cardiac arrest. Patient's son was reportedly caring for her sacral decubitus ulcer when he turned her back over and realized she was not breathing/pulseless. Patient's son immediately started CPR. Initial rhythm on first responder arrival was asystole with completion of CPR x 2 rounds (8 total minutes) and Epi x 1 with ROSC. King airway was placed in the field, exchanged to ETT. Low-dose Levophed was initiated.  On arrival to ED, labs notable for WBC 15K, H&H 8.0/28.9, Plt 515. Na 130, K 4.4, CO2 17, BUN 6, Cr 1.15. Trop 19, LA 7.9. ABG 7.370/54.8/546/27.4. CXR demonstrating masslike opacity in L lung apex. CT Head demonstrating mixed density R subdural collection (acute/chronic) with mild mass effect, slight < 53mm R to L midline shift.  PCCM consulted for admission.  Pertinent Medical History:  HTN, T2DM, dementia, lung CA (s/p resection), FTT  Significant Hospital Events: Including procedures, antibiotic start and stop dates in addition to other pertinent events   . 5/24 - BIB EMS s/p cardiac arrest; CPR x 2 rounds (8 total minutes) and Epi x 1 with ROSC. CT Head with mixed acute/chronic subdural collection with mass effect/slight midline shift. Elevated LA. On low dose pressors. . 5/29 off pressors . 5/30 propofol resumed for clinical seizures/lip twitching  Interim History / Subjective:   She is critically ill, intubated. Sedated on propofol due to intermittent twitching of right face and lip  urine output stays low  Objective:  Blood pressure 116/63, pulse 75, temperature (!) 96.8 F (36 C), temperature source  Axillary, resp. rate 18, height 5' 4.02" (1.626 m), weight 50 kg, SpO2 100 %.    Vent Mode: PRVC FiO2 (%):  [30 %] 30 % Set Rate:  [18 bmp] 18 bmp Vt Set:  [430 mL] 430 mL PEEP:  [5 cmH20] 5 cmH20 Plateau Pressure:  [21 cmH20-22 cmH20] 21 cmH20   Intake/Output Summary (Last 24 hours) at 02/14/2021 7510 Last data filed at 02/14/2021 0900 Gross per 24 hour  Intake 2345.6 ml  Output 145 ml  Net 2200.6 ml   Filed Weights   02/12/21 0255 02/13/21 0408 02/14/21 0500  Weight: 50 kg 50 kg 50 kg   Physical Examination: General:  [name] HEENT:  Mild pallor, no icterus, oral ETT Neuro:  [name], does not follow commands, intermittent twitching of right face and lip on propofol drip CV:  S1-S2 regular PULM:  Bilateral air entry present, no accessory muscle use GI:  Soft, nontender nondistended bowel sounds present Extremities:  1+ edema both arms, right extremity thigh wound Skin: Sacral decubitus per nursing documentation.    Pressure Injury 02/05/2021 Sacrum Anterior;Mid Stage 4 - Full thickness tissue loss with exposed bone, tendon or muscle. 5cmx5cm (Active)  02/04/2021 1820  Location: Sacrum  Location Orientation: Anterior;Mid  Staging: Stage 4 - Full thickness tissue loss with exposed bone, tendon or muscle.  Wound Description (Comments): 5cmx5cm  Present on Admission: Yes     Pressure Injury 02/03/2021 Thigh Posterior;Right Stage 2 -  Partial thickness loss of dermis presenting as a shallow open injury with a red, pink wound bed without slough. (Active)  01/30/2021  1820  Location: Thigh  Location Orientation: Posterior;Right  Staging: Stage 2 -  Partial thickness loss of dermis presenting as a shallow open injury with a red, pink wound bed without slough.  Wound Description (Comments):   Present on Admission:     Labs/imaging that I have personally reviewed: (right click and "Reselect all SmartList Selections" daily)   LTM EEG 5/26  worsening myoclonic seizures nowconsistent with myoclonic status epilepticus  MRI brain 5/29 Symmetric abnormal diffusion and T2/FLAIR signal abnormality involving the basal ganglia, thalami, perirolandic cortices, and cerebellum. Findings consistent with anoxic brain injury Subacute subdural hematoma overlying the right cerebral convexity measuring up to 1.3 cm with associated trace right-to-left shift  Advanced temporal lobe predominant cerebral atrophy   Labs 5/31 show improved potassium, worsening creatinine, drop in hemoglobin to 6.8 Chest x-ray left upper lobe lung mass  Resolved Hospital Problem List:     Assessment & Plan:   [name] who experienced cardiopulmonary arrest 5/24 while son was delivering care, ROSC achieved after 8 minutes of CPR. She was intubated and started on pressor support. CT head showed acute on chronic right sided subdural hematoma and subsequently found to be having a myoclonic seizure.    Acute cardiopulmonary arrest  -Etiology unclear, consider sepsis.  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, status  Poor prognosis for meaningful recovery given to family Seizure seem to recur when propofol stopped, hence we will continue We will use Versed as needed and see if we can come off propofol Continue valproate  Acute hypoxic respiratory failure  Not a candidate for weaning  Severe sepsis -Completed course of Zosyn Neg cultures  AKI Plan: Not a candidate for dialysis due to severe anoxic brain injury Hypokalemia has been repleted, holding off on Lasix while creatinine rising  Anemia -hemoglobin is drifted down from 9.6-6.8, no evidence of blood loss -1 unit PRBC transfusion  LUE swelling, unilateral -Doppler extremity negative for DVT  Sacral decubitus ulcer -Wound care consult appreciated  History of diabetes -CBGs  History of lung  cancer s/p resection Outpatient follow-up if survives  Goals of care: DNR, palliative care following Poor prognosis for meaningful recovery given to sons.  Shaun who is from [place] is agreeable to withdraw life support.  [name] who was caregiver has still not come to terms with this prognosis.  Continue medical care  Best Practice: (right click and "Reselect all SmartList Selections" daily)  Diet:  Tube Feed  Pain/Anxiety/Delirium protocol (if indicated): Yes (RASS goal 0) VAP protocol (if indicated): Yes DVT prophylaxis: Contraindicated GI prophylaxis: PPI Glucose control:  SSI No Central venous access:  N/A Arterial line:  N/A Foley:  Yes, and it is still needed Mobility:  bed rest  PT consulted: N/A Last date of multidisciplinary goals of care discussion [5/30] Code Status:  DNR Disposition: ICU Family: sons  Labs:   CBC: Recent Labs  Lab 01/19/2021 1319 02/10/2021 1324 01/17/2021 1506 02/08/21 0733 02/13/21 0230 02/14/21 0449  WBC 15.7*  --   --  33.3* 2.7* 6.0  HGB 8.0* 8.5* 8.8* 9.6* 7.0* 6.8*  HCT 28.9* 25.0* 26.0* 32.3* 21.7* 21.8*  MCV 83.0  --   --  79.4* 71.1* 73.9*  PLT 515*  --   --  521* 199 200    Basic Metabolic Panel: Recent Labs  Lab 01/16/2021 1319 01/24/2021 1324 02/08/2021 1506 02/13/2021 1800 02/08/21 0733 02/13/21 0230 02/13/21 1114 02/13/21 1818 02/14/21  0449  NA 130* 130* 132* 129* 134* 137  --   --  136  K 4.4 4.3 3.6 4.0 5.0 2.3*  --   --  3.8  CL 98 96*  --  101 98 104  --   --  100  CO2 17*  --   --  22 21* 23  --   --  21*  GLUCOSE 183* 185*  --  239* 131* 68*  --   --  126*  BUN 6* 6*  --  8 17 26*  --   --  34*  CREATININE 1.15* 0.80  --  0.89 1.31* 1.48*  --   --  2.22*  CALCIUM 7.8*  --   --  6.8* 7.8* 7.2*  --   --  7.3*  MG  --   --   --   --  1.8 2.0  --  1.9 2.0  PHOS  --   --   --   --  5.4*  --  3.8 3.6 3.2   GFR: Estimated Creatinine Clearance: 17.5 mL/min (A) (by C-G formula based on SCr of 2.22 mg/dL (H)). Recent  Labs  Lab 03/02/21 1319 2021/03/02 1342 Mar 02, 2021 1541 02/08/21 0733 02/13/21 0230 02/14/21 0449  WBC 15.7*  --   --  33.3* 2.7* 6.0  LATICACIDVEN  --  7.9* 3.2*  --   --   --     Liver Function Tests: No results for input(s): AST, ALT, ALKPHOS, BILITOT, PROT, ALBUMIN in the last 168 hours. No results for input(s): LIPASE, AMYLASE in the last 168 hours. Recent Labs  Lab 03/02/2021 1645  AMMONIA 40*    ABG:    Component Value Date/Time   PHART 7.307 (L) 2021-03-02 1506   PCO2ART 54.8 (H) 2021/03/02 1506   PO2ART 546 (H) March 02, 2021 1506   HCO3 27.4 02-Mar-2021 1506   TCO2 29 03-02-21 1506   O2SAT 100.0 2021/03/02 1506     Coagulation Profile: No results for input(s): INR, PROTIME in the last 168 hours.  Cardiac Enzymes: No results for input(s): CKTOTAL, CKMB, CKMBINDEX, TROPONINI in the last 168 hours.  HbA1C: No results found for: HGBA1C  CBG: Recent Labs  Lab 02/13/21 1147 02/13/21 1612 02/13/21 1924 02/13/21 2327 02/14/21 0356  GLUCAP 131* 80 89 101* 136*    My independent critical care time x 31 m   Cyril Mourning MD. FCCP. [facility] Pager : [phone]  If no response to pager , please call [phone] until 7 pm After 7:00 pm call Elink  [phone]    02/14/2021 9:52 AM

## 2021-02-14 NOTE — Progress Notes (Signed)
Chaplain was with patient and her son when she came in after a massive heart attack. After tests confirming that patient had no brain activity and would probably pass, her son was very emotional and blaming himself for his mother's condition. Son and mother live in a hotel room and he works at the hotel in exchange for the room. He has been caring for his mother for a long time and he feels responsible. He has been working in the mornings and sitting by her bed every afternoon until visiting hours are over. He refuses to allow her to be placed on comfort care and have her extubated even though his brother came from [place] and wanted to do that. Chaplain is offering emotional and spiritual support and is available when needed.    02/14/21 1800  Clinical Encounter Type  Visited With Patient and family together  Visit Type Follow-up;Critical Care;Patient actively dying  Referral From Nurse;Family  Consult/Referral To Chaplain

## 2021-02-14 NOTE — Progress Notes (Signed)
eLink Physician-Brief Progress Note Patient Name: Anna Acosta DOB: 01-Oct-1945 MRN: [id number]   Date of Service  02/14/2021  HPI/Events of Note  Anemia - Hgb = 6.8.  eICU Interventions  Will transfuse 1 unit PRBC.     Intervention Category Major Interventions: Other:  Lenell Antu 02/14/2021, 6:47 AM

## 2021-02-15 DIAGNOSIS — L8944 Pressure ulcer of contiguous site of back, buttock and hip, stage 4: Secondary | ICD-10-CM

## 2021-02-15 DIAGNOSIS — E43 Unspecified severe protein-calorie malnutrition: Secondary | ICD-10-CM

## 2021-02-15 DIAGNOSIS — R627 Adult failure to thrive: Secondary | ICD-10-CM

## 2021-02-15 LAB — TYPE AND SCREEN
ABO/RH(D): B POS
Antibody Screen: NEGATIVE
Unit division: 0

## 2021-02-15 LAB — BPAM RBC
Blood Product Expiration Date: 202206132359
ISSUE DATE / TIME: 202205311342
Unit Type and Rh: 7300

## 2021-02-15 LAB — GLUCOSE, CAPILLARY
Glucose-Capillary: 130 mg/dL — ABNORMAL HIGH (ref 70–99)
Glucose-Capillary: 127 mg/dL — ABNORMAL HIGH (ref 70–99)
Glucose-Capillary: 109 mg/dL — ABNORMAL HIGH (ref 70–99)
Glucose-Capillary: 94 mg/dL (ref 70–99)
Glucose-Capillary: 142 mg/dL — ABNORMAL HIGH (ref 70–99)
Glucose-Capillary: 127 mg/dL — ABNORMAL HIGH (ref 70–99)

## 2021-02-15 NOTE — Progress Notes (Signed)
NAME:  Anna Acosta MRN:  [id number] DOB:  May 01, 1946 LOS: 8 ADMISSION DATE:  02/09/2021 CONSULTATION DATE:  01/27/2021 REFERRING MD:  Myrtis Ser CHIEF COMPLAINT:  Cardiac arrest   History of Present Illness:  76 year old chronically unwell female with PMHx significant for HTN, T2DM, lung cancer (s/p resection), dementia and FTT who presented to [facility] ED 5/24 via EMS s/p cardiac arrest. Patient's son was reportedly caring for her sacral decubitus ulcer when he turned her back over and realized she was not breathing/pulseless. Patient's son immediately started CPR. Initial rhythm on first responder arrival was asystole with completion of CPR x 2 rounds (8 total minutes) and Epi x 1 with ROSC. King airway was placed in the field, exchanged to ETT. Low-dose Levophed was initiated.  On arrival to ED, labs notable for WBC 15K, H&H 8.0/28.9, Plt 515. Na 130, K 4.4, CO2 17, BUN 6, Cr 1.15. Trop 19, LA 7.9. ABG 7.370/54.8/546/27.4. CXR demonstrating masslike opacity in L lung apex. CT Head demonstrating mixed density R subdural collection (acute/chronic) with mild mass effect, slight < 55mm R to L midline shift.  PCCM consulted for admission.  Pertinent Medical History:  HTN, T2DM, dementia, lung CA (s/p resection), FTT  Significant Hospital Events: Including procedures, antibiotic start and stop dates in addition to other pertinent events   . 5/24 - BIB EMS s/p cardiac arrest; CPR x 2 rounds (8 total minutes) and Epi x 1 with ROSC. CT Head with mixed acute/chronic subdural collection with mass effect/slight midline shift. Elevated LA. On low dose pressors. . 5/29 off pressors . 5/30 propofol resumed for clinical seizures/lip twitching  Interim History / Subjective:   Remains on the vent with poor mental status  Objective:  Blood pressure 130/75, pulse 73, temperature (!) 96.5 F (35.8 C), temperature source Axillary, resp. rate 18, height 5' 4.02" (1.626 m), weight 52.7 kg, SpO2 100 %.    Vent  Mode: PRVC FiO2 (%):  [30 %] 30 % Set Rate:  [18 bmp] 18 bmp Vt Set:  [430 mL] 430 mL PEEP:  [5 cmH20] 5 cmH20 Plateau Pressure:  [20 cmH20-23 cmH20] 20 cmH20   Intake/Output Summary (Last 24 hours) at 02/15/2021 0747 Last data filed at 02/15/2021 0600 Gross per 24 hour  Intake 2243.56 ml  Output 580 ml  Net 1663.56 ml   Filed Weights   02/13/21 0408 02/14/21 0500 02/15/21 0500  Weight: 50 kg 50 kg 52.7 kg   Physical Examination: Gen:      No acute distress, critically ill-appearing HEENT:  EOMI, sclera anicteric, ET tube Neck:     No masses; no thyromegaly Lungs:    Clear to auscultation bilaterally; normal respiratory effort CV:         Regular rate and rhythm; no murmurs Abd:      + bowel sounds; soft, non-tender; no palpable masses, no distension Ext:    No edema; adequate peripheral perfusion Skin:      Warm and dry; no rash, sacral decubitus Neuro: Unresponsive   Labs/imaging that I have personally reviewed: (right click and "Reselect all SmartList Selections" daily)   LTM EEG 5/26 worsening myoclonic seizures nowconsistent with myoclonic status epilepticus  MRI brain 5/29 Symmetric abnormal diffusion and T2/FLAIR signal abnormality involving the basal ganglia, thalami, perirolandic cortices, and cerebellum. Findings consistent with anoxic brain injury Subacute subdural hematoma overlying the right cerebral convexity measuring up to 1.3 cm with associated trace right-to-left shift  Advanced temporal lobe predominant cerebral atrophy    No new labs  or imaging  Resolved Hospital Problem List:     Assessment & Plan:   57 yoF who experienced cardiopulmonary arrest 5/24 while son was delivering care, ROSC achieved after 8 minutes of CPR. She was intubated and started on pressor support. CT head showed acute on chronic right sided subdural hematoma and subsequently found to be having a myoclonic seizure.    Acute cardiopulmonary arrest  -Etiology unclear, consider  sepsis.  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  Not a candidate for weaning given mental status  Severe sepsis, present on admission Finished Zosyn.  Observe off antibiotics  AKI Plan: Not a candidate for dialysis due to severe anoxic brain injury Repeat labs  Anemia of critical illness and chronic disease No evidence of active bleeding.  Follow CBC  LUE swelling, unilateral -Doppler extremity negative for DVT  Sacral decubitus ulcer Wound care consult  History of diabetes -CBGs  History of lung cancer s/p resection Outpatient follow-up if survives  Goals of care: Has poor prognosis for meaningful recovery She is currently DNR but family not ready for withdrawal of care yet Palliative care has been consulted.  Discussed with son Gregary Signs who is here from [place].  Will await arrival of [name] her other son  Best Practice: (right click and "Reselect all SmartList Selections" daily)  Diet:  Tube Feed  Pain/Anxiety/Delirium protocol (if indicated): Yes (RASS goal 0) VAP protocol (if indicated): Yes DVT prophylaxis: Contraindicated GI prophylaxis: PPI Glucose control:  SSI No Central venous access:  N/A Arterial line:  N/A Foley:  Yes, and it is still needed Mobility:  bed rest  PT consulted: N/A Last date of multidisciplinary goals of care discussion [5/30] Code Status:  DNR Disposition: ICU Family: sons  Signature:    The patient is critically ill with multiple organ system failure and requires high complexity decision making for assessment and support, frequent evaluation and titration of therapies, advanced monitoring, review of radiographic studies and interpretation of complex data.   Critical  Care Time devoted to patient care services, exclusive of separately billable procedures, described in this note is 35 minutes.   Chilton Greathouse MD [place] [facility] See Amion for pager  If no response to pager , please call [phone] until 7pm After 7:00 pm call Elink  [phone] 02/15/2021, 7:48 AM

## 2021-02-15 NOTE — Progress Notes (Signed)
    Progress Note from the Palliative Medicine Team at [facility]   Patient Name: Anna Acosta        Date: 02/15/2021 DOB: 1945/11/14  Age: 75 y.o. MRN#: [id number] Attending Physician: Marshell Garfinkel, MD Primary Care Physician: Default, Provider, MD Admit Date: 01/16/2021   Medical records reviewed   75 y.o. female  admitted on 02/06/2021 with PMH of hypertension, diabetes, severe dementia, prior lung cancer with an apparent left upper lobe resection, prior subdural hematoma (per her son's history).   She has failure to thrive has been largely dependent for her care, bedbound and has a large deep sacral decubitus ulcer. Cared for in the home by son/Steven  She experienced cardiopulmonary arrest 5/24 while her son was delivering care.  He immediately started CPR. She was in asystole when telemetry placed, 8 more minutes of CPR. She had a King airway that was exchanged to an ET tube when she arrived to the emergency department.   MR Brain 02-12-21 IMPRESSION: 1. Symmetric abnormal diffusion and T2/FLAIR signal abnormality involving the basal ganglia, thalami, perirolandic cortices, and cerebellum. Findings consistent with anoxic brain injury. No significant mass effect. 2. Subacute subdural hematoma overlying the right cerebral convexity measuring up to 1.3 cm with associated trace right-to-left shift. 3. Advanced temporal lobe predominant cerebral atrophy with underlying chronic microvascular ischemic disease. 4. Loss of normal flow void within the left V4 segment, likely [place].  Long term poor prognosis  Family face treatment options decisions, advanced directive decisions and anticipatory care needs.    This NP has attempted multiple times to contact son/Steven without success.  Today  Son/Shaun is at bedside, I met Saun earlier this week.  He lives in [place] and is traveling [place] and forth to be with is mother.  He and his brother have a challenging  relationship.  Dr Vaughan Browner at bedside updating son on medical situation.   Education offered regarding the pending decisions specific to ongoing life prolonging measures and appropriateness of those interventions   Questions and concerns addressed   Discussed with bedside RN  Total time spent on the unit was 20 minutes  This nurse practitioner informed  the family and the attending that I will be out of the hospital until Monday morning.  If the patient is still hospitalized I will follow-up at that time.  Call palliative medicine team phone # [phone] with questions or concerns in the interim  Greater than 50% of the time was spent in counseling and coordination of care  Wadie Lessen NP  Palliative Medicine Team Team Phone # [phone] Pager [phone]

## 2021-02-15 DEATH — deceased

## 2021-02-16 LAB — CBC
HCT: 25.4 % — ABNORMAL LOW (ref 36.0–46.0)
Hemoglobin: 8 g/dL — ABNORMAL LOW (ref 12.0–15.0)
MCH: 24 pg — ABNORMAL LOW (ref 26.0–34.0)
MCHC: 31.5 g/dL (ref 30.0–36.0)
MCV: 76 fL — ABNORMAL LOW (ref 80.0–100.0)
Platelets: 221 10*3/uL (ref 150–400)
RBC: 3.34 MIL/uL — ABNORMAL LOW (ref 3.87–5.11)
RDW: 18.9 % — ABNORMAL HIGH (ref 11.5–15.5)
WBC: 9.8 10*3/uL (ref 4.0–10.5)
nRBC: 0 % (ref 0.0–0.2)

## 2021-02-16 LAB — BASIC METABOLIC PANEL
Anion gap: 9 (ref 5–15)
BUN: 60 mg/dL — ABNORMAL HIGH (ref 8–23)
CO2: 25 mmol/L (ref 22–32)
Calcium: 7.8 mg/dL — ABNORMAL LOW (ref 8.9–10.3)
Chloride: 105 mmol/L (ref 98–111)
Creatinine, Ser: 1.52 mg/dL — ABNORMAL HIGH (ref 0.44–1.00)
GFR, Estimated: 36 mL/min — ABNORMAL LOW (ref >60)
Glucose, Bld: 122 mg/dL — ABNORMAL HIGH (ref 70–99)
Potassium: 2.9 mmol/L — ABNORMAL LOW (ref 3.5–5.1)
Sodium: 139 mmol/L (ref 135–145)

## 2021-02-16 LAB — GLUCOSE, CAPILLARY
Glucose-Capillary: 110 mg/dL — ABNORMAL HIGH (ref 70–99)
Glucose-Capillary: 95 mg/dL (ref 70–99)
Glucose-Capillary: 125 mg/dL — ABNORMAL HIGH (ref 70–99)
Glucose-Capillary: 118 mg/dL — ABNORMAL HIGH (ref 70–99)
Glucose-Capillary: 99 mg/dL (ref 70–99)
Glucose-Capillary: 125 mg/dL — ABNORMAL HIGH (ref 70–99)

## 2021-02-16 MED ORDER — POTASSIUM CHLORIDE 20 MEQ PO PACK
40.0000 meq | PACK | Freq: Two times a day (BID) | ORAL | Status: AC
  Administered 2021-02-16 (×2): 40 meq via ORAL
  Filled 2021-02-16 (×2): qty 2

## 2021-02-16 NOTE — Progress Notes (Signed)
Nutrition Follow-up  DOCUMENTATION CODES:   Severe malnutrition in context of chronic illness  INTERVENTION:   Continue tube feeding via OG tube: - Vital AF 1.2 @ 50 ml/hr (1200 ml/day)  Tube feeding regimen provides 1440 kcal, 90 grams of protein, and 973 ml of H2O.   Tube feeding regimen and current propofol provides 1699 total kcal.  - Continue MVI with minerals daily per tube  - Continue 1 packet Juven BID, each packet provides 95 calories, 2.5 grams of protein, and 9.8 grams of carbohydrate; also contains L-arginine and L-glutamine, vitamin C, vitamin E, vitamin B-12, zinc, calcium, and calcium Beta-hydroxy-Beta-methylbutyrate to support wound healing  NUTRITION DIAGNOSIS:   Severe Malnutrition related to chronic illness (dementia) as evidenced by severe muscle depletion,severe fat depletion.  Ongoing, being addressed via TF  GOAL:   Patient will meet greater than or equal to 90% of their needs  Met via TF  MONITOR:   Vent status,Labs,Weight trends,TF tolerance,Skin,I & O's  REASON FOR ASSESSMENT:   Consult Enteral/tube feeding initiation and management  ASSESSMENT:   75 year old female who presented to the ED on 5/24 after a witnessed cardiac arrest. PMH of HTN, T2DM, dementia, prior lung cancer s/p LUL resection, prior SDH, sacral decubitus ulcer. Pt was intubated in the field.  Discussed pt with RN and during ICU rounds. Pt with anoxic brain injury and has seizures when propofol turned off. One of pt's sons is realistic about the outcome and is advocating for comfort measures. Pt's other son wants full support including trach and PEG if needed.  OG tube remains in place with tube feeds infusing.  Admit weight: 47.4 kg Current weight: 51.6 kg  Weight trending up. Pt with deep pitting edema to BUE and mild pitting edema to BLE.  Patient is currently intubated on ventilator support MV: 8.0 L/min Temp (24hrs), Avg:98.6 F (37 C), Min:97.6 F (36.4 C),  Max:99.3 F (37.4 C)  Drips: Propofol: 9.8 ml/hr (provides 259 kcal daily from lipid)  Medications reviewed and include: MVI with minerals daily, Juven BID, protonix  Labs reviewed: BUN 34, creatinine 2.22, TG 152, hemoglobin 6.8 CBG's: 94-142 x 24 hours  UOP: 705 ml x 24 hours I/O's: +12.0 L since admit  Diet Order:   Diet Order            Diet NPO time specified  Diet effective now                 EDUCATION NEEDS:   No education needs have been identified at this time  Skin:  Skin Assessment: Skin Integrity Issues: Stage II: R thigh Stage IV: sacrum  Last BM:  02/16/21 large type 7  Height:   Ht Readings from Last 1 Encounters:  02/06/2021 5' 4.02" (1.626 m)    Weight:   Wt Readings from Last 1 Encounters:  02/16/21 51.6 kg    BMI:  Body mass index is 19.52 kg/m.  Estimated Nutritional Needs:   Kcal:  1400-1600  Protein:  75-90 grams  Fluid:  >1.5 L/day    Gustavus Bryant, MS, RD, LDN Inpatient Clinical Dietitian Please see AMiON for contact information.

## 2021-02-16 NOTE — Progress Notes (Addendum)
NAME:  Anna Acosta MRN:  [id number] DOB:  08-11-1946 LOS: 9 ADMISSION DATE:  02/02/2021 CONSULTATION DATE:  02/11/2021 REFERRING MD:  Myrtis Ser CHIEF COMPLAINT:  Cardiac arrest   History of Present Illness:  75 year old chronically unwell female with PMHx significant for HTN, T2DM, lung cancer (s/p resection), dementia and FTT who presented to [facility] ED 5/24 via EMS s/p cardiac arrest. Patient's son was reportedly caring for her sacral decubitus ulcer when he turned her back over and realized she was not breathing/pulseless. Patient's son immediately started CPR. Initial rhythm on first responder arrival was asystole with completion of CPR x 2 rounds (8 total minutes) and Epi x 1 with ROSC. King airway was placed in the field, exchanged to ETT. Low-dose Levophed was initiated.  On arrival to ED, labs notable for WBC 15K, H&H 8.0/28.9, Plt 515. Na 130, K 4.4, CO2 17, BUN 6, Cr 1.15. Trop 19, LA 7.9. ABG 7.370/54.8/546/27.4. CXR demonstrating masslike opacity in L lung apex. CT Head demonstrating mixed density R subdural collection (acute/chronic) with mild mass effect, slight < 81mm R to L midline shift.  PCCM consulted for admission.  Pertinent Medical History:  HTN, T2DM, dementia, lung CA (s/p resection), FTT  Significant Hospital Events: Including procedures, antibiotic start and stop dates in addition to other pertinent events   . 5/24 - BIB EMS s/p cardiac arrest; CPR x 2 rounds (8 total minutes) and Epi x 1 with ROSC. CT Head with mixed acute/chronic subdural collection with mass effect/slight midline shift. Elevated LA. On low dose pressors. . 5/29 off pressors . 5/30 propofol resumed for clinical seizures/lip twitching  Interim History / Subjective:   Remains on the vent with poor mental status  Objective:  Blood pressure 126/75, pulse 73, temperature 97.6 F (36.4 C), temperature source Axillary, resp. rate 18, height 5' 4.02" (1.626 m), weight 51.6 kg, SpO2 100 %.    Vent Mode:  PRVC FiO2 (%):  [30 %] 30 % Set Rate:  [18 bmp] 18 bmp Vt Set:  [430 mL] 430 mL PEEP:  [5 cmH20] 5 cmH20 Plateau Pressure:  [20 cmH20-25 cmH20] 21 cmH20   Intake/Output Summary (Last 24 hours) at 02/16/2021 0831 Last data filed at 02/16/2021 9678 Gross per 24 hour  Intake 1549.67 ml  Output 705 ml  Net 844.67 ml   Filed Weights   02/14/21 0500 02/15/21 0500 02/16/21 0356  Weight: 50 kg 52.7 kg 51.6 kg   Physical Examination: Gen:      No acute distress, chronically ill HEENT:  EOMI, sclera anicteric, ETT Neck:     No masses; no thyromegaly Lungs:    Clear to auscultation bilaterally; normal respiratory effort CV:         Regular rate and rhythm; no murmurs Abd:      + bowel sounds; soft, non-tender; no palpable masses, no distension Ext:    No edema; adequate peripheral perfusion Skin:      Warm and dry; no rash Neuro: Unresponsive   Labs/imaging that I have personally reviewed: (right click and "Reselect all SmartList Selections" daily)   LTM EEG 5/26 worsening myoclonic seizures nowconsistent with myoclonic status epilepticus  MRI brain 5/29 Symmetric abnormal diffusion and T2/FLAIR signal abnormality involving the basal ganglia, thalami, perirolandic cortices, and cerebellum. Findings consistent with anoxic brain injury Subacute subdural hematoma overlying the right cerebral convexity measuring up to 1.3 cm with associated trace right-to-left shift  Advanced temporal lobe predominant cerebral atrophy    No new labs or imaging  Resolved  Hospital Problem List:     Assessment & Plan:   42 yoF who experienced cardiopulmonary arrest 5/24 while son was delivering care, ROSC achieved after 8 minutes of CPR. She was intubated and started on pressor support. CT head showed acute on chronic right sided subdural hematoma and subsequently found to be having a myoclonic seizure.    Acute cardiopulmonary arrest  Etiology unclear.  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  Not a candidate for weaning given mental status  Severe sepsis, present on admission Finished Zosyn.  Observe off antibiotics  AKI Repeat labs  Anemia of critical illness and chronic disease No evidence of active bleeding.  Follow CBC  LUE swelling, unilateral Doppler extremity negative for DVT  Sacral decubitus ulcer Wound care consult  History of diabetes CBGs  History of lung cancer s/p resection Outpatient follow-up if survives  Severe malnutrition secondary to chronic illness Wound care on board  Goals of care: Has poor prognosis for meaningful recovery She is currently DNR but family not ready for withdrawal of care yet Palliative care has been consulted.  Discussed with sons Sidonie Dickens from [place] [facility] [place].  Shaun is realistic about the outcome and is advocating comfort measures [name] wants full support including trach and PEG if needed  Best Practice: (right click and "Reselect all SmartList Selections" daily)  Diet:  Tube Feed  Pain/Anxiety/Delirium protocol (if indicated): Yes (RASS goal -1) VAP protocol (if indicated): Yes DVT prophylaxis: LMWH GI prophylaxis: PPI Glucose control:  SSI No Central venous access:  N/A Arterial line:  N/A Foley:  Yes, and it is still needed due to sacral ulcer Mobility:  bed rest  PT consulted: N/A Last date of multidisciplinary goals of care discussion [6/1] Code Status:  DNR Disposition: ICU Family: sons  Signature:    The patient is critically ill with multiple organ system failure and requires high complexity decision making for assessment and support, frequent evaluation and titration of therapies, advanced monitoring,  review of radiographic studies and interpretation of complex data.   Critical Care Time devoted to patient care services, exclusive of separately billable procedures, described in this note is 35 minutes.   Chilton Greathouse MD [facility] See Amion for pager  If no response to pager , please call [phone] until 7pm After 7:00 pm call Elink  [phone] 02/16/2021, 8:41 AM

## 2021-02-16 NOTE — Plan of Care (Signed)
Patient has an OGT with tube feeds and supplements to meet nutritional needs. The patient has a foley catheter to prevent urinary retention.    Problem: Nutrition: Goal: Adequate nutrition will be maintained Outcome: Progressing   Problem: Elimination: Goal: Will not experience complications related to urinary retention Outcome: Progressing    Patient is unresponsive and unable to acknowledge learning or demonstrate learning. Patient is unresponsive and is only moving when the staff perform passive range of motion. Patient still has a stage 4 pressure injury to sacrum. Patient is being turned every 2 hours to prevent further breakdown but patient is incontinent of stool.    Problem: Education: Goal: Knowledge of General Education information will improve Description: Including pain rating scale, medication(s)/side effects and non-pharmacologic comfort measures Outcome: Not Progressing   Problem: Health Behavior/Discharge Planning: Goal: Ability to manage health-related needs will improve Outcome: Not Progressing   Problem: Activity: Goal: Risk for activity intolerance will decrease Outcome: Not Progressing   Problem: Skin Integrity: Goal: Risk for impaired skin integrity will decrease Outcome: Not Progressing

## 2021-02-17 LAB — BASIC METABOLIC PANEL
Anion gap: 8 (ref 5–15)
BUN: 65 mg/dL — ABNORMAL HIGH (ref 8–23)
CO2: 25 mmol/L (ref 22–32)
Calcium: 8 mg/dL — ABNORMAL LOW (ref 8.9–10.3)
Chloride: 109 mmol/L (ref 98–111)
Creatinine, Ser: 1.26 mg/dL — ABNORMAL HIGH (ref 0.44–1.00)
GFR, Estimated: 45 mL/min — ABNORMAL LOW (ref >60)
Glucose, Bld: 112 mg/dL — ABNORMAL HIGH (ref 70–99)
Potassium: 4 mmol/L (ref 3.5–5.1)
Sodium: 142 mmol/L (ref 135–145)

## 2021-02-17 LAB — CBC
HCT: 25.6 % — ABNORMAL LOW (ref 36.0–46.0)
Hemoglobin: 7.9 g/dL — ABNORMAL LOW (ref 12.0–15.0)
MCH: 24 pg — ABNORMAL LOW (ref 26.0–34.0)
MCHC: 30.9 g/dL (ref 30.0–36.0)
MCV: 77.8 fL — ABNORMAL LOW (ref 80.0–100.0)
Platelets: 263 10*3/uL (ref 150–400)
RBC: 3.29 MIL/uL — ABNORMAL LOW (ref 3.87–5.11)
RDW: 19.9 % — ABNORMAL HIGH (ref 11.5–15.5)
WBC: 10.6 10*3/uL — ABNORMAL HIGH (ref 4.0–10.5)
nRBC: 0 % (ref 0.0–0.2)

## 2021-02-17 LAB — GLUCOSE, CAPILLARY
Glucose-Capillary: 112 mg/dL — ABNORMAL HIGH (ref 70–99)
Glucose-Capillary: 113 mg/dL — ABNORMAL HIGH (ref 70–99)
Glucose-Capillary: 115 mg/dL — ABNORMAL HIGH (ref 70–99)
Glucose-Capillary: 116 mg/dL — ABNORMAL HIGH (ref 70–99)
Glucose-Capillary: 132 mg/dL — ABNORMAL HIGH (ref 70–99)
Glucose-Capillary: 138 mg/dL — ABNORMAL HIGH (ref 70–99)

## 2021-02-17 LAB — MAGNESIUM: Magnesium: 2.1 mg/dL (ref 1.7–2.4)

## 2021-02-17 LAB — PHOSPHORUS: Phosphorus: 1.4 mg/dL — ABNORMAL LOW (ref 2.5–4.6)

## 2021-02-17 MED ORDER — SODIUM PHOSPHATES 45 MMOLE/15ML IV SOLN
20.0000 mmol | Freq: Once | INTRAVENOUS | Status: AC
  Administered 2021-02-17: 20 mmol via INTRAVENOUS
  Filled 2021-02-17: qty 6.67

## 2021-02-17 NOTE — Progress Notes (Signed)
NAME:  Anna Acosta MRN:  [id number] DOB:  Apr 19, 1946 LOS: 10 ADMISSION DATE:  2021/03/06 CONSULTATION DATE:  03/06/21 REFERRING MD:  Myrtis Ser CHIEF COMPLAINT:  Cardiac arrest   History of Present Illness:  75 year old chronically unwell female with PMHx significant for HTN, T2DM, lung cancer (s/p resection), dementia and FTT who presented to [facility] ED 5/24 via EMS s/p cardiac arrest. Patient's son was reportedly caring for her sacral decubitus ulcer when he turned her back over and realized she was not breathing/pulseless. Patient's son immediately started CPR. Initial rhythm on first responder arrival was asystole with completion of CPR x 2 rounds (8 total minutes) and Epi x 1 with ROSC. King airway was placed in the field, exchanged to ETT. Low-dose Levophed was initiated.  On arrival to ED, labs notable for WBC 15K, H&H 8.0/28.9, Plt 515. Na 130, K 4.4, CO2 17, BUN 6, Cr 1.15. Trop 19, LA 7.9. ABG 7.370/54.8/546/27.4. CXR demonstrating masslike opacity in L lung apex. CT Head demonstrating mixed density R subdural collection (acute/chronic) with mild mass effect, slight < 80mm R to L midline shift.  PCCM consulted for admission.  Pertinent Medical History:  HTN, T2DM, dementia, lung CA (s/p resection), FTT  Significant Hospital Events: Including procedures, antibiotic start and stop dates in addition to other pertinent events   . 5/24 - BIB EMS s/p cardiac arrest; CPR x 2 rounds (8 total minutes) and Epi x 1 with ROSC. CT Head with mixed acute/chronic subdural collection with mass effect/slight midline shift. Elevated LA. On low dose pressors. . 5/29 off pressors . 5/30 propofol resumed for clinical seizures/lip twitching  Interim History / Subjective:   Remains on the vent with poor mental status. Still with seizure activity on clinical exam. Lengthy d/w with son steven was attempted to explain status epilepticus and damage to brain etc, but he spoke over me and yelled continuously after  approximately 6 mins of yelling about natural treatments for seizure and "pulling the plug" I stated I was going to terminate the conversation until he was less upset. He then hung up the phone.   I also spoke with Sidonie Dickens, son as well and explained the same. He endorsed that he would like to transition to comfort care for his mother and understands that she cannot live with her brain in a constant state of seizure. He however does not want to supersede his brother.   I advised both of them that I will be consulting ethics for medical futility in this case as I do not this a trach in a patient still seizing for >9 days will change her outcome or improve her quality of life.   Objective:  Blood pressure 130/62, pulse 68, temperature (!) 97.5 F (36.4 C), temperature source Axillary, resp. rate 18, height 5' 4.02" (1.626 m), weight 55 kg, SpO2 100 %.    Vent Mode: PRVC FiO2 (%):  [30 %] 30 % Set Rate:  [18 bmp] 18 bmp Vt Set:  [430 mL] 430 mL PEEP:  [5 cmH20] 5 cmH20 Plateau Pressure:  [19 cmH20-21 cmH20] 19 cmH20   Intake/Output Summary (Last 24 hours) at 02/17/2021 1209 Last data filed at 02/17/2021 1100 Gross per 24 hour  Intake 2050.78 ml  Output 930 ml  Net 1120.78 ml   Filed Weights   02/15/21 0500 02/16/21 0356 02/17/21 0220  Weight: 52.7 kg 51.6 kg 55 kg   Physical Examination: Gen:      Chronically ill appearing vent in place.  HEENT:  Roving  eyes, rhythmic twitching with mouth sclera anicteric, ETT Neck:     No masses; no thyromegaly Lungs:    Clear to auscultation bilaterally; normal respiratory effort CV:         Regular rate and rhythm; no murmurs Abd:      + bowel sounds; soft, non-tender; no palpable masses, no distension Ext:    No edema;  Skin:      Warm and dry; no rash Neuro: Unresponsive   Labs/imaging that I have personally reviewed: (right click and "Reselect all SmartList Selections" daily)   LTM EEG 5/26 worsening myoclonic seizures nowconsistent with  myoclonic status epilepticus  MRI brain 5/29 Symmetric abnormal diffusion and T2/FLAIR signal abnormality involving the basal ganglia, thalami, perirolandic cortices, and cerebellum. Findings consistent with anoxic brain injury Subacute subdural hematoma overlying the right cerebral convexity measuring up to 1.3 cm with associated trace right-to-left shift  Advanced temporal lobe predominant cerebral atrophy    No new labs or imaging  Resolved Hospital Problem List:     Assessment & Plan:   74 yoF who experienced cardiopulmonary arrest 5/24 while son was delivering care, ROSC achieved after 8 minutes of CPR. She was intubated and started on pressor support. CT head showed acute on chronic right sided subdural hematoma and subsequently found to be having a myoclonic seizure.    Acute cardiopulmonary arrest  Etiology unclear.  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  Not a candidate for weaning given mental status  Severe sepsis, present on admission Finished Zosyn.  Observe off antibiotics  AKI Labs improving Urine still oliguric  Anemia of critical illness and chronic disease No evidence of active bleeding.  Follow CBC  LUE swelling, unilateral Doppler extremity negative for DVT  Sacral decubitus ulcer Wound care consult  History of diabetes CBGs  History of lung cancer s/p resection Outpatient follow-up if survives  Severe malnutrition secondary to chronic illness Wound care on board  Goals of care: Has poor prognosis for meaningful recovery She is currently DNR but family not ready for withdrawal of care yet Palliative care has been  consulted.  Discussed with sons Sidonie Dickens from [employer] [place].  Shaun is realistic about the outcome and is advocating comfort measures [name] wants full support including trach and PEG if needed with active seizure activity feel this pt meets medical futility. Administrator, [employer].   Best Practice: (right click and "Reselect all SmartList Selections" daily)  Diet:  Tube Feed  Pain/Anxiety/Delirium protocol (if indicated): Yes (RASS goal -1) VAP protocol (if indicated): Yes DVT prophylaxis: LMWH GI prophylaxis: PPI Glucose control:  SSI No Central venous access:  N/A Arterial line:  N/A Foley:  Yes, and it is still needed due to sacral ulcer Mobility:  bed rest  PT consulted: N/A Last date of multidisciplinary goals of care discussion [6/1] Code Status:  DNR Disposition: ICU Family: sons  Signature:       Critical care time: The patient is critically ill with multiple organ systems failure and requires high complexity decision making for assessment and support, frequent evaluation and titration of therapies, application of advanced monitoring technologies and extensive interpretation of multiple databases.  Critical care time 42 mins. This represents my time independent of the NPs time taking care of  the pt. This is excluding procedures.    Briant Sites DO [facility] 02/17/2021, 12:10 PM See Amion for pager If no response to pager, please call [phone] until 1900 After 1900 please call [facility] [phone]

## 2021-02-17 NOTE — Progress Notes (Signed)
Consulted ethics 2/2 pt's condition: medical futility  Also have contacted security in light of Steven's belligerent and threatening yelling to RN and myself via phone.

## 2021-02-17 NOTE — Progress Notes (Signed)
This RN entered the room to do 1800 rounds and the 2 sons Sidonie Dickens and [name]) are in the room. The sons were asked if there were any questions. [name] states he doesn't understand why the patient isn't being fed "fruits and vegetables." This RN explained that the tube feeding the patient is receiving carries with it all of the nutrients the patient needs/would get from fruits and vegetables. The son continues to state that the last time she "couldn't speak or move" he fed her fruits and vegetables and the patient was able to speak and move again after some time on that diet. The patient is requesting to speak with the dietician face to face to speak about giving her "blended up fruits and vegetables through her tube." The son also states he will not accept that her condition is irreversible until he sees that the fruits and vegetables do not work. This RN will pass along that [name] wants to speak to the dietician.

## 2021-02-17 NOTE — Progress Notes (Signed)
Patient's son, Anna Acosta, called this RN and immediately began yelling over the phone at this RN. When this RN attempts to speak to the son he yells louder and gets more upset. This RN, after the phone call ended, contacted the front desk and security to inform them of the sons behavior.

## 2021-02-18 ENCOUNTER — Inpatient Hospital Stay (HOSPITAL_COMMUNITY): Payer: Medicare Other

## 2021-02-18 LAB — BASIC METABOLIC PANEL
Anion gap: 7 (ref 5–15)
BUN: 68 mg/dL — ABNORMAL HIGH (ref 8–23)
CO2: 27 mmol/L (ref 22–32)
Calcium: 8.3 mg/dL — ABNORMAL LOW (ref 8.9–10.3)
Chloride: 109 mmol/L (ref 98–111)
Creatinine, Ser: 1.09 mg/dL — ABNORMAL HIGH (ref 0.44–1.00)
GFR, Estimated: 53 mL/min — ABNORMAL LOW (ref >60)
Glucose, Bld: 133 mg/dL — ABNORMAL HIGH (ref 70–99)
Potassium: 3.9 mmol/L (ref 3.5–5.1)
Sodium: 143 mmol/L (ref 135–145)

## 2021-02-18 LAB — GLUCOSE, CAPILLARY
Glucose-Capillary: 102 mg/dL — ABNORMAL HIGH (ref 70–99)
Glucose-Capillary: 103 mg/dL — ABNORMAL HIGH (ref 70–99)
Glucose-Capillary: 134 mg/dL — ABNORMAL HIGH (ref 70–99)
Glucose-Capillary: 135 mg/dL — ABNORMAL HIGH (ref 70–99)
Glucose-Capillary: 82 mg/dL (ref 70–99)
Glucose-Capillary: 95 mg/dL (ref 70–99)

## 2021-02-18 LAB — PHOSPHORUS: Phosphorus: 2.2 mg/dL — ABNORMAL LOW (ref 2.5–4.6)

## 2021-02-18 LAB — ALBUMIN: Albumin: 1.7 g/dL — ABNORMAL LOW (ref 3.5–5.0)

## 2021-02-18 MED ORDER — ENOXAPARIN SODIUM 40 MG/0.4ML IJ SOSY
40.0000 mg | PREFILLED_SYRINGE | INTRAMUSCULAR | Status: DC
  Administered 2021-02-18 – 2021-02-23 (×6): 40 mg via SUBCUTANEOUS
  Filled 2021-02-18 (×6): qty 0.4

## 2021-02-18 MED ORDER — LEVETIRACETAM 100 MG/ML PO SOLN
1000.0000 mg | Freq: Two times a day (BID) | ORAL | Status: DC
  Administered 2021-02-18 – 2021-02-24 (×13): 1000 mg
  Filled 2021-02-18 (×15): qty 10

## 2021-02-18 MED ORDER — VALPROIC ACID 250 MG/5ML PO SOLN
250.0000 mg | Freq: Two times a day (BID) | ORAL | Status: DC
  Administered 2021-02-18 – 2021-02-24 (×13): 250 mg
  Filled 2021-02-18 (×12): qty 5

## 2021-02-18 MED ORDER — POTASSIUM PHOSPHATES 15 MMOLE/5ML IV SOLN
20.0000 mmol | Freq: Once | INTRAVENOUS | Status: AC
  Administered 2021-02-18: 20 mmol via INTRAVENOUS
  Filled 2021-02-18: qty 6.67

## 2021-02-18 NOTE — Progress Notes (Signed)
Situation: Follow-up visit for spiritual/emotional support for pt Anna Acosta. Referral from RN, requesting chaplain speak with pt's son, [name].  Background: Facts: Ms. [name] appeared unconscious and unable to communicate at this time. Mr. [name] shared that they are originally from [place], but have been in [place] when is mother underwent "cardiac arrest" recently. Family: Mr. [name] shared that he has an older brother. Feelings: Mr. [name] expressed concern for his mother's health, but also seemed hopeful. Faith: This chaplain noticed that Ms. [name] was wearing a rosary, and Mr. 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  Actions & Assessments: Chaplain introduced self and services, sharing that faith-specific community clergy can be contacted as needed. Mr. [name] thanked this chaplain for the visit, but did not express any needs at this time.  Recommendations: For Chaplains: Continue to be available to family to help process possible feelings of guilt (see previous chaplain notes) and anticipatory grief. For Staff: See previous chaplain notes for more background information. Chaplain services remain available for follow-up spiritual/emotional support as needed.  Rev. Mayme Genta, MDiv     02/18/21 1900  Clinical Encounter Type  Visited With Patient and family together  Visit Type Follow-up  Referral From Nurse

## 2021-02-18 NOTE — Progress Notes (Signed)
eLink Physician-Brief Progress Note Patient Name: Anna Acosta DOB: 1946/05/29 MRN: [id number]   Date of Service  02/18/2021  HPI/Events of Note  Patient edematous and nursing reports fine crackles at bases.   eICU Interventions  Plan: 1. Add albumin level to previous blood work. 2. Portable CXR in AM.      Intervention Category Major Interventions: Other:  Jaliel Deavers Dennard Nip 02/18/2021, 3:26 AM

## 2021-02-18 NOTE — Progress Notes (Signed)
Ethics consult note, brief.  Spoke w/ attending physician yesterday 02/17/21 re: appropriate surrogate decision maker and appropriate ethically justified course of action in patient with minimal probability of medical recovery. Two sons, who are legal decision makers. One requesting comfort care. One requesting aggressive measures. Ideally would want general consensus among surrogates but there seems to be impassable conflict and furth ermore the son requesting aggressive care has been verbally hostile to medical team. Regardless of the wishes of the surrogates however, any decision makers can be overridden IF this is a case in which aggressive medical intervention is deemed to be futile by attending physician and at least one other physician consultant. Medical team is not ethically obligated to offer/continue futile interventions which will have no chance of produci ng meaningful recovery to minimally acceptable quality of life. Full consult note to follow. Thank you for involving Ethics on this case and we will continue to be involved. Please see AMION for contact information for ethics committee member on-call.

## 2021-02-18 NOTE — Progress Notes (Signed)
eLink Physician-Brief Progress Note Patient Name: Anna Acosta DOB: June 11, 1946 MRN: [id number]   Date of Service  02/18/2021  HPI/Events of Note  Multiple issues: 1. Hypophosphatemia - PO4--- = 2.2 and K+ = 3.9 and 2. Ca++ = 8.3 which corrects to 10.14 (Normal) given albumin = 1.7.  eICU Interventions  Plan: 1. Will replace PO4---.      Intervention Category Major Interventions: Electrolyte abnormality - evaluation and management  Godric Lavell Eugene 02/18/2021, 4:55 AM

## 2021-02-18 NOTE — Progress Notes (Signed)
Spoke with son, Anna Acosta, about pt's condition.  Anna Acosta is very upset about mother's nutritional status and is insistent that she needs to have "ground up fruits and vegetables, so it can go to the stomach, then go to the heart, and then go to the brain to heal".  Says this is how he healed her before.  Says he has done a lot of "research about foods that fix seizures". Dr. Gaynell Face suggested he could pick up some natural ground up fruit and vegetable supplements and we could give those to her via feeding tube.  Son unhappy about this option over giving her pureed options but willing to look into it and potentially try it tomorrow if he can find them at the store tonight.  Anna Acosta expressed his frustration that he has been taking care of his mother for awhile and he stated he was "stretching out her arm and she yelled out in pain and it put too much stress on her heart".  RN re-assured him that his mothers cardiac arrest was in no way his fault and this event would have happened regardless if he was there or not.  RN called chaplain to come see if Anna Acosta would like to speak with her to let out some of his frustrations about his mothers condition/prognosis.

## 2021-02-18 NOTE — Progress Notes (Signed)
NAME:  Anna Acosta MRN:  [id number] DOB:  [birth year] LOS: 11 ADMISSION DATE:  01/24/2021 CONSULTATION DATE:  02/10/2021 REFERRING MD:  Myrtis Ser CHIEF COMPLAINT:  Cardiac arrest   History of Present Illness:  75 year old chronically unwell female with PMHx significant for HTN, T2DM, lung cancer (s/p resection), dementia and FTT who presented to [facility] ED 5/24 via EMS s/p cardiac arrest. Patient's son was reportedly caring for her sacral decubitus ulcer when he turned her back over and realized she was not breathing/pulseless. Patient's son immediately started CPR. Initial rhythm on first responder arrival was asystole with completion of CPR x 2 rounds (8 total minutes) and Epi x 1 with ROSC. King airway was placed in the field, exchanged to ETT. Low-dose Levophed was initiated.  On arrival to ED, labs notable for WBC 15K, H&H 8.0/28.9, Plt 515. Na 130, K 4.4, CO2 17, BUN 6, Cr 1.15. Trop 19, LA 7.9. ABG 7.370/54.8/546/27.4. CXR demonstrating masslike opacity in L lung apex. CT Head demonstrating mixed density R subdural collection (acute/chronic) with mild mass effect, slight < 27mm R to L midline shift.  PCCM consulted for admission.  Pertinent Medical History:  HTN, T2DM, dementia, lung CA (s/p resection), FTT  Significant Hospital Events: Including procedures, antibiotic start and stop dates in addition to other pertinent events   . 5/24 - BIB EMS s/p cardiac arrest; CPR x 2 rounds (8 total minutes) and Epi x 1 with ROSC. CT Head with mixed acute/chronic subdural collection with mass effect/slight midline shift. Elevated LA. On low dose pressors. . 5/29 off pressors . 5/30 propofol resumed for clinical seizures/lip twitching  Interim History / Subjective:  6/4: still with twitching on exam. tmax 101.9. stable cxr however without acute infiltrate and no increased secretions. wbc marginally up will monitor, hold resuming abx at this time. suspect neuro related. Awaiting ethics input but son  [name] now requesting to take his mother home as she is on the vent so there has been no progression on understanding the critical/grim condition  6/3:Remains on the vent with poor mental status. Still with seizure activity on clinical exam. Lengthy d/w with son steven was attempted to explain status epilepticus and damage to brain etc, but he spoke over me and yelled continuously after approximately 6 mins of yelling about natural treatments for seizure and "pulling the plug" I stated I was going to terminate the conversation until he was less upset. He then hung up the phone.   I also spoke with Sidonie Dickens, son as well and explained the same. He endorsed that he would like to transition to comfort care for his mother and understands that she cannot live with her brain in a constant state of seizure. He however does not want to supersede his brother.   I advised both of them that I will be consulting ethics for medical futility in this case as I do not this a trach in a patient still seizing for >9 days will change her outcome or improve her quality of life.   Objective:  Blood pressure (!) 153/59, pulse 74, temperature (!) 96.3 F (35.7 C), temperature source Axillary, resp. rate 18, height 5' 4.02" (1.626 m), weight 56.2 kg, SpO2 100 %.    Vent Mode: PRVC FiO2 (%):  [30 %] 30 % Set Rate:  [18 bmp] 18 bmp Vt Set:  [430 mL] 430 mL PEEP:  [5 cmH20] 5 cmH20 Plateau Pressure:  [20 cmH20-22 cmH20] 22 cmH20   Intake/Output Summary (Last 24 hours) at 02/18/2021  1024 Last data filed at 02/18/2021 0900 Gross per 24 hour  Intake 2136.2 ml  Output 1240 ml  Net 896.2 ml   Filed Weights   02/16/21 0356 02/17/21 0220 02/18/21 0341  Weight: 51.6 kg 55 kg 56.2 kg   Physical Examination: Gen:      Chronically ill appearing vent in place.  HEENT:  Roving eyes, rhythmic twitching with mouth sclera anicteric, ETT Neck:     No masses; no thyromegaly Lungs:    Clear to auscultation bilaterally some diminished  bs on L; normal respiratory effort CV:         Regular rate and rhythm; no murmurs Abd:      + bowel sounds; soft, non-tender; no palpable masses, no distension Ext:    No edema;  Skin:      Warm and dry; no rash Neuro: Unresponsive   Labs/imaging that I have personally reviewed: (right click and "Reselect all SmartList Selections" daily)   LTM EEG 5/26 worsening myoclonic seizures nowconsistent with myoclonic status epilepticus  MRI brain 5/29 Symmetric abnormal diffusion and T2/FLAIR signal abnormality involving the basal ganglia, thalami, perirolandic cortices, and cerebellum. Findings consistent with anoxic brain injury Subacute subdural hematoma overlying the right cerebral convexity measuring up to 1.3 cm with associated trace right-to-left shift  Advanced temporal lobe predominant cerebral atrophy    No new labs or imaging  Resolved Hospital Problem List:     Assessment & Plan:   42 yoF who experienced cardiopulmonary arrest 5/24 while son was delivering care, ROSC achieved after 8 minutes of CPR. She was intubated and started on pressor support. CT head showed acute on chronic right sided subdural hematoma and subsequently found to be having a myoclonic seizure.    Acute cardiopulmonary arrest  Etiology unclear.  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  Not a candidate for weaning given mental status  Severe sepsis, present on admission Finished Zosyn.  Observe off antibiotics  AKI Labs improving Urine still oliguric  Anemia of critical illness and chronic disease No evidence of active bleeding.  Follow CBC  LUE  swelling, unilateral Doppler extremity negative for DVT  Sacral decubitus ulcer Wound care consult  History of diabetes CBGs  History of lung cancer s/p resection Outpatient follow-up if survives  Severe malnutrition secondary to chronic illness Wound care on board  Goals of care: Has poor prognosis for meaningful recovery She is currently DNR but family not ready for withdrawal of care yet Palliative care has been consulted.  Discussed with sons Sidonie Dickens from [place] and [place].  Shaun is realistic about the outcome and is advocating comfort measures [name] wants full support including trach and PEG if needed with active seizure activity feel this pt meets medical futility. Administrator, [employer].   Best Practice: (right click and "Reselect all SmartList Selections" daily)  Diet:  Tube Feed  Pain/Anxiety/Delirium protocol (if indicated): Yes (RASS goal -1) VAP protocol (if indicated): Yes DVT prophylaxis: LMWH GI prophylaxis: PPI Glucose control:  SSI No Central venous access:  N/A Arterial line:  N/A Foley:  Yes, and it is still needed due to sacral ulcer Mobility:  bed rest  PT consulted: N/A Last date of multidisciplinary goals of care discussion [6/3] Code Status:  DNR Disposition: ICU Family: sons  Signature:       Critical care time: The patient is critically ill with multiple organ systems failure and requires high complexity decision making for assessment and support, frequent evaluation and titration of therapies, application of advanced monitoring technologies and extensive interpretation of multiple databases.  Critical care time 44 mins. This represents my time independent of the NPs time taking care of the pt. This is excluding procedures.    Briant Sites DO [facility] 02/18/2021, 10:24 AM See Amion for pager If no response to pager, please call [phone] until 1900 After 1900 please call [facility] [phone]

## 2021-02-19 ENCOUNTER — Inpatient Hospital Stay (HOSPITAL_COMMUNITY): Payer: Medicare Other

## 2021-02-19 LAB — GLUCOSE, CAPILLARY
Glucose-Capillary: 102 mg/dL — ABNORMAL HIGH (ref 70–99)
Glucose-Capillary: 107 mg/dL — ABNORMAL HIGH (ref 70–99)
Glucose-Capillary: 111 mg/dL — ABNORMAL HIGH (ref 70–99)
Glucose-Capillary: 132 mg/dL — ABNORMAL HIGH (ref 70–99)
Glucose-Capillary: 133 mg/dL — ABNORMAL HIGH (ref 70–99)
Glucose-Capillary: 94 mg/dL (ref 70–99)

## 2021-02-19 LAB — BASIC METABOLIC PANEL
Anion gap: 6 (ref 5–15)
BUN: 71 mg/dL — ABNORMAL HIGH (ref 8–23)
CO2: 26 mmol/L (ref 22–32)
Calcium: 8.1 mg/dL — ABNORMAL LOW (ref 8.9–10.3)
Chloride: 111 mmol/L (ref 98–111)
Creatinine, Ser: 1.01 mg/dL — ABNORMAL HIGH (ref 0.44–1.00)
GFR, Estimated: 58 mL/min — ABNORMAL LOW (ref >60)
Glucose, Bld: 143 mg/dL — ABNORMAL HIGH (ref 70–99)
Potassium: 3.6 mmol/L (ref 3.5–5.1)
Sodium: 143 mmol/L (ref 135–145)

## 2021-02-19 LAB — CBC
HCT: 23.7 % — ABNORMAL LOW (ref 36.0–46.0)
Hemoglobin: 7.2 g/dL — ABNORMAL LOW (ref 12.0–15.0)
MCH: 23.9 pg — ABNORMAL LOW (ref 26.0–34.0)
MCHC: 30.4 g/dL (ref 30.0–36.0)
MCV: 78.7 fL — ABNORMAL LOW (ref 80.0–100.0)
Platelets: 297 10*3/uL (ref 150–400)
RBC: 3.01 MIL/uL — ABNORMAL LOW (ref 3.87–5.11)
RDW: 21.7 % — ABNORMAL HIGH (ref 11.5–15.5)
WBC: 11.4 10*3/uL — ABNORMAL HIGH (ref 4.0–10.5)
nRBC: 0.2 % (ref 0.0–0.2)

## 2021-02-19 LAB — TRIGLYCERIDES: Triglycerides: 45 mg/dL (ref <150)

## 2021-02-19 LAB — PHOSPHORUS: Phosphorus: 2.5 mg/dL (ref 2.5–4.6)

## 2021-02-19 LAB — PROCALCITONIN: Procalcitonin: 1.18 ng/mL

## 2021-02-19 MED ORDER — LISINOPRIL 10 MG PO TABS
10.0000 mg | ORAL_TABLET | Freq: Every day | ORAL | Status: DC
  Administered 2021-02-19 – 2021-02-22 (×4): 10 mg
  Filled 2021-02-19 (×4): qty 1

## 2021-02-19 MED ORDER — LABETALOL HCL 5 MG/ML IV SOLN
10.0000 mg | INTRAVENOUS | Status: DC | PRN
  Administered 2021-02-19: 10 mg via INTRAVENOUS
  Filled 2021-02-19: qty 4

## 2021-02-19 MED ORDER — LABETALOL HCL 100 MG PO TABS
100.0000 mg | ORAL_TABLET | Freq: Two times a day (BID) | ORAL | Status: DC
  Administered 2021-02-19 – 2021-02-22 (×7): 100 mg
  Filled 2021-02-19 (×8): qty 1

## 2021-02-19 NOTE — Significant Event (Signed)
Referred to bedside -- the patient's son Anna Acosta arrived to the bedside reportedly with a portable blender full of green substance (per son "green juice") and a large bottle of clear liquid labled "Eternal naturally alkaline spring water" demanding to self-administer these liquids to the patient.  Despite RN direction not to administer these liquids, the patient's son Anna Acosta poured the clear liquid from the "alkaline water" bottle into the patient's free water flush bag.  RN immediately stopped the patient's free water ( bag contents contaminated by patient's son with "alkaline water")  He became agitated when again told he cannot administer these liquids and demanded to bring the green liquid to pharmacy for pharmacist review of the green liquid to seek their approval for administration.   At my time of arrival, patient's son Anna Acosta was no longer present on the unit and had reportedly left to find main pharmacy.   Security is present on the unit at this time. In communication with security and MICU charge RN, I feel the patient's son Anna Acosta poses a risk to the patient (administering unknown liquids to the patient regardless of attempts to de-escalate)  As such, Anna Acosta will not be allowed to re-enter the ICU today 02/19/2021.   Security has been dispatched to main pharmacy   Tessie Fass MSN, AGACNP-BC [facility] Medicine Amion for pager  02/19/2021, 6:13 PM

## 2021-02-19 NOTE — Progress Notes (Signed)
Patient's son, [name] came to visit this patient. He is very adamant on this patient receiving her fruits and vegetables through her feeding tube. He had a cup with what he said was " Blended broccoli, tomatoes, and spinach." I had explained to him that we cannot administer anything that came outside of the hospital. I asked him if there was a RN that helps him with his mother, he said, "no, I don't need it, I take care of her." He mentioned multiple times about him needing to "make up some of the money he spent out of her social security" He also stated that, " I had to start feeding her salad and fruits and veggies only because she was too heavy for me to lift her" I stayed in the room and spoke to him for about 15-20 minutes and he seemed to have calmed down. I stepped outside of the room, and was out for about 5 minutes when the charge RN noticed him pouring an unknown liquid in a clear bottle labeled "Alkaline Water"  into the patients feeding tube bag. I went into the room and told him he could not put any outside substance in her feeding tube, as all of our medications have to be first verified through pharmacy and we cannot administer outside substances to our patients. I stopped the tube feeds  and disconnected from the patient, and will they will both replaced. He then stormed out of the room, stating, "Take me to the pharmacist then." Our NT followed him out to ensure he didn't harm anyone and on his way out, security was called to both pharmacy and to our unit. He returned later to the unit, demanding to talk to the doctor. Delorise Shiner, our NP went into the room to speak with him. He went on for about 5 minutes about wanting to administer the green liquid. Delorise Shiner attempted to de-escalate the situation, and explained to him why this wasn't appropriate. His tone of voice was starting to escalate and then he became verbally abusive with Delorise Shiner and then told her that " you doctors are only in it for the money." He  continued to scream and be belligerent toward multiple staff members and security escorted him out. He was asked to leave and not return, and the director was called and made aware of the situation. MD attempted to call brother to explain the situation, but no answer. When he returned the call, I updated him. He was very appropriate and apologetic for his brothers actions. Will speak with team more about the situation tomorrow morning.

## 2021-02-19 NOTE — Progress Notes (Deleted)
eLink Physician-Brief Progress Note Patient Name: Anna Acosta DOB: 1945-12-14 MRN: [id number]   Date of Service  02/19/2021  HPI/Events of Note  Patient on 100%/P 12. Last ABG on 6/3. Nursing request for ABG and CXR in AM.  eICU Interventions  Will order ABG and portable CXR at 5 AM.      Intervention Category Major Interventions: Hypoxemia - evaluation and management;Respiratory failure - evaluation and management  Suann Klier Dennard Nip 02/19/2021, 3:57 AM

## 2021-02-19 NOTE — Progress Notes (Addendum)
NAME:  Anna Acosta MRN:  [id number] DOB:  [birth year] LOS: 12 ADMISSION DATE:  02/14/2021 CONSULTATION DATE:  01/28/2021 REFERRING MD:  Myrtis Ser CHIEF COMPLAINT:  Cardiac arrest   History of Present Illness:  75 year old chronically unwell female with PMHx significant for HTN, T2DM, lung cancer (s/p resection), dementia and FTT who presented to [facility] ED 5/24 via EMS s/p cardiac arrest. Patient's son was reportedly caring for her sacral decubitus ulcer when he turned her back over and realized she was not breathing/pulseless. Patient's son immediately started CPR. Initial rhythm on first responder arrival was asystole with completion of CPR x 2 rounds (8 total minutes) and Epi x 1 with ROSC. King airway was placed in the field, exchanged to ETT. Low-dose Levophed was initiated.  On arrival to ED, labs notable for WBC 15K, H&H 8.0/28.9, Plt 515. Na 130, K 4.4, CO2 17, BUN 6, Cr 1.15. Trop 19, LA 7.9. ABG 7.370/54.8/546/27.4. CXR demonstrating masslike opacity in L lung apex. CT Head demonstrating mixed density R subdural collection (acute/chronic) with mild mass effect, slight < 95mm R to L midline shift.  PCCM consulted for admission.  Pertinent Medical History:  HTN, T2DM, dementia, lung CA (s/p resection), FTT  Significant Hospital Events: Including procedures, antibiotic start and stop dates in addition to other pertinent events   . 5/24 - BIB EMS s/p cardiac arrest; CPR x 2 rounds (8 total minutes) and Epi x 1 with ROSC. CT Head with mixed acute/chronic subdural collection with mass effect/slight midline shift. Elevated LA. On low dose pressors. . 5/29 off pressors . 5/30 propofol resumed for clinical seizures/lip twitching  Interim History / Subjective:  6/5: pt remains in status, degree of oral twitching is irrelevant as we have been unable to cease her seizing and myoclonus due to hypoxic injury. Neuro infact signed off and left grim prognosis on 5/30.  tmax 101.9. remains vented.   6/4:  still with twitching on exam. tmax 101.9. stable cxr however without acute infiltrate and no increased secretions. wbc marginally up will monitor, hold resuming abx at this time. suspect neuro related. Awaiting ethics input but son [name] now requesting to take his mother home as she is on the vent so there has been no progression on understanding the critical/grim condition  6/3:Remains on the vent with poor mental status. Still with seizure activity on clinical exam. Lengthy d/w with son steven was attempted to explain status epilepticus and damage to brain etc, but he spoke over me and yelled continuously after approximately 6 mins of yelling about natural treatments for seizure and "pulling the plug" I stated I was going to terminate the conversation until he was less upset. He then hung up the phone.   I also spoke with Sidonie Dickens, son as well and explained the same. He endorsed that he would like to transition to comfort care for his mother and understands that she cannot live with her brain in a constant state of seizure. He however does not want to supersede his brother.   I advised both of them that I will be consulting ethics for medical futility in this case as I do not this a trach in a patient still seizing for >9 days will change her outcome or improve her quality of life.   Objective:  Blood pressure (!) 177/89, pulse (!) 108, temperature 97.8 F (36.6 C), temperature source Axillary, resp. rate 18, height 5' 4.02" (1.626 m), weight 56.2 kg, SpO2 99 %.    Vent Mode: PRVC FiO2 (%):  [  30 %] 30 % Set Rate:  [18 bmp] 18 bmp Vt Set:  [430 mL] 430 mL PEEP:  [5 cmH20] 5 cmH20 Plateau Pressure:  [19 cmH20-21 cmH20] 21 cmH20   Intake/Output Summary (Last 24 hours) at 02/19/2021 0806 Last data filed at 02/19/2021 0600 Gross per 24 hour  Intake 2021.66 ml  Output 1005 ml  Net 1016.66 ml   Filed Weights   02/16/21 0356 02/17/21 0220 02/18/21 0341  Weight: 51.6 kg 55 kg 56.2 kg   Physical  Examination: Gen:      Chronically ill appearing vent in place.  HEENT:  Roving eyes, rhythmic twitching with mouth sclera anicteric, ETT Neck:     No masses; no thyromegaly Lungs:    Clear to auscultation bilaterally some diminished bs on L; normal respiratory effort CV:         Regular rate and rhythm; no murmurs Abd:      + bowel sounds; soft, non-tender; no palpable masses, no distension Ext:    No edema;  Skin:      Warm and dry; no rash Neuro: Unresponsive   Labs/imaging that I have personally reviewed: (right click and "Reselect all SmartList Selections" daily)   LTM EEG 5/26 worsening myoclonic seizures nowconsistent with myoclonic status epilepticus  MRI brain 5/29 Symmetric abnormal diffusion and T2/FLAIR signal abnormality involving the basal ganglia, thalami, perirolandic cortices, and cerebellum. Findings consistent with anoxic brain injury Subacute subdural hematoma overlying the right cerebral convexity measuring up to 1.3 cm with associated trace right-to-left shift  Advanced temporal lobe predominant cerebral atrophy      Resolved Hospital Problem List:     Assessment & Plan:   [name] who experienced cardiopulmonary arrest 5/24 while son was delivering care, ROSC achieved after 8 minutes of CPR. She was intubated and started on pressor support. CT head showed acute on chronic right sided subdural hematoma and subsequently found to be having a myoclonic seizure.    Acute cardiopulmonary arrest  Etiology unclear.  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  Not a candidate for weaning given mental status  Severe sepsis, present on admission Finished Zosyn.  Observe off antibiotics -with lgt will check pct and cxr.  -suspect less likely 2/2 infection at this time but rather 2/2 neuro  AKI Labs improving Urine output adequate  Anemia of critical illness and chronic disease No evidence of active bleeding but hgb is down a bit today.  Follow CBC  LUE swelling, unilateral Doppler extremity negative for DVT  Sacral decubitus ulcer Wound care consult  History of diabetes CBGs  History of lung cancer s/p resection Outpatient follow-up if survives  Severe malnutrition secondary to chronic illness Wound care on board  Goals of care: Has poor prognosis for meaningful recovery She is currently DNR but family not ready for withdrawal of care yet Palliative care has been consulted.  Discussed with sons Sidonie Dickens from  and [place].  Shaun is realistic about the outcome and is advocating comfort measures [name] wants full support including trach and PEG if needed with active seizure  activity feel this pt meets medical futility. Administrator, [employer].   Best Practice: (right click and "Reselect all SmartList Selections" daily)  Diet:  Tube Feed  Pain/Anxiety/Delirium protocol (if indicated): Yes (RASS goal -1) VAP protocol (if indicated): Yes DVT prophylaxis: LMWH GI prophylaxis: PPI Glucose control:  SSI No Central venous access:  N/A Arterial line:  N/A Foley:  Yes, and it is still needed due to sacral ulcer Mobility:  bed rest  PT consulted: N/A Last date of multidisciplinary goals of care discussion [6/3] Code Status:  DNR Disposition: ICU Family: sons  Signature:       Critical care time: The patient is critically ill with multiple organ systems failure and requires high complexity decision making for  assessment and support, frequent evaluation and titration of therapies, application of advanced monitoring technologies and extensive interpretation of multiple databases.  Critical care time 40 mins. This represents my time independent of the NPs time taking care of the pt. This is excluding procedures.    Briant Sites DO [facility] 02/19/2021, 8:06 AM See Amion for pager If no response to pager, please call [phone] until 1900 After 1900 please call [facility] [phone]

## 2021-02-19 NOTE — Significant Event (Addendum)
Patient's son Anna Acosta arrived to the ICU almost immediately after prior entry was complete (suspect security simply did not have time to communicate this)  Anna Acosta was irate seeming, yelling that we "are not feeding her heart and her brain will never heal if we don't feed her heart" and that the medical team "is just trying to pull the plug." He yelled that the green liquid was "spinach and water and tomato and vegetables" and yells that he has seen this liquid heal her brain -- and continuing yelling largely along these thought trains for about 5 minutes before I was able to speak.   I tried to verbally calm the situation and acknowledged that Anna Acosta clearly cares deeply for his mom, and that I too appreciate the importance of nutrition, however these outside liquids cannot be administered to the patient and moreover Anna Acosta cannot self-administer substances to the patient.   Anna Acosta became quite irate again, frustrated that he knows how to care for his mother and feels that the medical team just wants to pull the plug.   I tried again to calm the situation and reiterate that I am hear to speak about the administration of these liquids to the patient, which is not permitted. He responded verbally aggressive, with aggressive and agitated psychomotor behavior.  He yelled that if we won't do this his way, he promises his mother is going to lay there until her brain heals.  Ultimately after several more minutes of verbally aggressively behavior, Anna Acosta was politely told he needs to leave the ICU, and security immediately escorted him out of the ICU.    In light of this increasingly aggressive behavior toward multiple staff, in addition to the risks posed to the patient by Steven's disregard for safe/appropriate medication/nutrition administration-- I recommend that Anna Acosta not be permitted to visit the ICU, ongoing.  MICU unit director made aware, Security aware and notifying [employer]. Will additionally notify  MICU medical director.  Attempts to reach patient's other son, Anna Acosta, following these events were not successful.     Anna Fass MSN, AGACNP-BC [facility] 02/19/2021, 6:43 PM

## 2021-02-20 LAB — BASIC METABOLIC PANEL
Anion gap: 7 (ref 5–15)
BUN: 63 mg/dL — ABNORMAL HIGH (ref 8–23)
CO2: 27 mmol/L (ref 22–32)
Calcium: 8.1 mg/dL — ABNORMAL LOW (ref 8.9–10.3)
Chloride: 114 mmol/L — ABNORMAL HIGH (ref 98–111)
Creatinine, Ser: 0.94 mg/dL (ref 0.44–1.00)
GFR, Estimated: >60 mL/min (ref >60)
Glucose, Bld: 126 mg/dL — ABNORMAL HIGH (ref 70–99)
Potassium: 3.7 mmol/L (ref 3.5–5.1)
Sodium: 148 mmol/L — ABNORMAL HIGH (ref 135–145)

## 2021-02-20 LAB — CBC
HCT: 24.9 % — ABNORMAL LOW (ref 36.0–46.0)
Hemoglobin: 7.6 g/dL — ABNORMAL LOW (ref 12.0–15.0)
MCH: 24.4 pg — ABNORMAL LOW (ref 26.0–34.0)
MCHC: 30.5 g/dL (ref 30.0–36.0)
MCV: 79.8 fL — ABNORMAL LOW (ref 80.0–100.0)
Platelets: 333 10*3/uL (ref 150–400)
RBC: 3.12 MIL/uL — ABNORMAL LOW (ref 3.87–5.11)
RDW: 22.5 % — ABNORMAL HIGH (ref 11.5–15.5)
WBC: 11.8 10*3/uL — ABNORMAL HIGH (ref 4.0–10.5)
nRBC: 0 % (ref 0.0–0.2)

## 2021-02-20 LAB — GLUCOSE, CAPILLARY
Glucose-Capillary: 149 mg/dL — ABNORMAL HIGH (ref 70–99)
Glucose-Capillary: 89 mg/dL (ref 70–99)
Glucose-Capillary: 125 mg/dL — ABNORMAL HIGH (ref 70–99)
Glucose-Capillary: 99 mg/dL (ref 70–99)
Glucose-Capillary: 105 mg/dL — ABNORMAL HIGH (ref 70–99)
Glucose-Capillary: 126 mg/dL — ABNORMAL HIGH (ref 70–99)

## 2021-02-20 LAB — PROCALCITONIN: Procalcitonin: 1.02 ng/mL

## 2021-02-20 LAB — MAGNESIUM: Magnesium: 2 mg/dL (ref 1.7–2.4)

## 2021-02-20 MED ORDER — FREE WATER
200.0000 mL | Freq: Four times a day (QID) | Status: DC
  Administered 2021-02-20 – 2021-02-24 (×17): 200 mL

## 2021-02-20 MED ORDER — LOPERAMIDE HCL 1 MG/7.5ML PO SUSP
2.0000 mg | Freq: Two times a day (BID) | ORAL | Status: DC | PRN
  Administered 2021-02-20 – 2021-02-23 (×4): 2 mg
  Filled 2021-02-20 (×5): qty 15

## 2021-02-20 NOTE — Progress Notes (Addendum)
NAME:  Anna Acosta MRN:  [id number] DOB:  Jun 15, 1946 LOS: 13 ADMISSION DATE:  21-Feb-2021 CONSULTATION DATE:  02/21/21 REFERRING MD:  Myrtis Ser CHIEF COMPLAINT:  Cardiac arrest   History of Present Illness:  75 year old chronically unwell female with PMHx significant for HTN, T2DM, lung cancer (s/p resection), dementia and FTT who presented to [facility] ED 5/24 via EMS s/p cardiac arrest. Patient's son was reportedly caring for her sacral decubitus ulcer when he turned her back over and realized she was not breathing/pulseless. Patient's son immediately started CPR. Initial rhythm on first responder arrival was asystole with completion of CPR x 2 rounds (8 total minutes) and Epi x 1 with ROSC. King airway was placed in the field, exchanged to ETT. Low-dose Levophed was initiated.  On arrival to ED, labs notable for WBC 15K, H&H 8.0/28.9, Plt 515. Na 130, K 4.4, CO2 17, BUN 6, Cr 1.15. Trop 19, LA 7.9. ABG 7.370/54.8/546/27.4. CXR demonstrating masslike opacity in L lung apex. CT Head demonstrating mixed density R subdural collection (acute/chronic) with mild mass effect, slight < 37mm R to L midline shift.  PCCM consulted for admission.  Pertinent Medical History:  HTN, T2DM, dementia, lung CA (s/p resection), FTT  Significant Hospital Events: Including procedures, antibiotic start and stop dates in addition to other pertinent events   . 5/24 - BIB EMS s/p cardiac arrest; CPR x 2 rounds (8 total minutes) and Epi x 1 with ROSC CT Head with mixed acute/chronic subdural collection with mass effect/slight midline shift. Elevated LA. On low dose pressors . 5/29 off pressors. . 5/30 propofol resumed for clinical seizures/lip twitching. Neuro signed off citing minimal to no chance for a neurologic recovery.  . 6/2 Myoclonic seizures ongoing despite maximal therapy including propofol. . 6/3 Ethics consult for medical futility.   Interim History / Subjective:  Weekend events reviewed. No acute events  overnight. Failed SBT 20/5 immediately this morning.   Objective:  Blood pressure (!) 142/67, pulse 87, temperature 97.6 F (36.4 C), temperature source Axillary, resp. rate 18, height 5' 4.02" (1.626 m), weight 54.9 kg, SpO2 100 %.    Vent Mode: PRVC FiO2 (%):  [30 %] 30 % Set Rate:  [18 bmp] 18 bmp Vt Set:  [430 mL] 430 mL PEEP:  [5 cmH20] 5 cmH20 Plateau Pressure:  [21 cmH20-23 cmH20] 21 cmH20   Intake/Output Summary (Last 24 hours) at 02/20/2021 0830 Last data filed at 02/20/2021 0600 Gross per 24 hour  Intake 1340.76 ml  Output 1310 ml  Net 30.76 ml   Filed Weights   02/17/21 0220 02/18/21 0341 02/20/21 0500  Weight: 55 kg 56.2 kg 54.9 kg   Physical Examination:  General:  Frail elderly female on vent. Neuro:  Unresponsive. Ongoing facial twitching.  HEENT:  [place]/AT, No JVD noted, PERRL Cardiovascular:  RRR, no MRG Lungs: Clear bilateral breath sounds Abdomen:  Soft, non-distended. Normoactive Musculoskeletal:  No acute deformity. Skin:  Sacral wound not assessed.    Labs/imaging that I have personally reviewed: (right click and "Reselect all SmartList Selections" daily)   LTM EEG 5/26 worsening myoclonic seizures nowconsistent with myoclonic status epilepticus  MRI brain 5/29 Symmetric abnormal diffusion and T2/FLAIR signal abnormality involving the basal ganglia, thalami, perirolandic cortices, and cerebellum. Findings consistent with anoxic brain injury Subacute subdural hematoma overlying the right cerebral convexity measuring up to 1.3 cm with associated trace right-to-left shift. Advanced temporal lobe predominant cerebral atrophy   Hemoglobin 7.6 up from 7.2   Resolved Hospital Problem List:  Assessment & Plan:   24 yoF who experienced cardiopulmonary arrest 5/24 while son was delivering care, ROSC achieved after 8 minutes of CPR. She was intubated and started on pressor support. CT head showed acute on chronic right sided subdural hematoma and subsequently  found to be having a myoclonic seizure.    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   Acute cardiopulmonary arrest: Etiology unclear. LVEF intact, b ut diastolic dysfunction noted on echo. HTN -Labetalol and low dose acei  Acute hypoxic respiratory failure  - Continue vent support.  Continues to fail SBT predictably. - No reasonable hope for extubation at this point. Vent day 12.  - Not a candidate for tracheostomy.   Severe sepsis, present on admissionFinished Zosyn.   - Observe off antibiotics - No fever past 24 hours. PCT 1 and trended down from 6/5 > 6/6. Defer ABX for now - Fevers likely due to ongoing myoclonic seizure and anoxic injury.   AKI, Labs improving, Urine output adequate Hypernatremia - Follow BMP - Will repeat labs this afternoon. If Na continues to rise/UUOP pick up will need eval for DI. Also wonder with NA up, BUN up, I wonder if she isnt simple a little dehydrated. Will add some free water.   Anemia of critical illness and chronic disease - No evidence of active bleeding but hgb is down a bit today.  - Follow CBC  LUE swelling, unilateral - Doppler extremity negative for DVT - Supportive care.   Sacral decubitus ulcer - Wound care consulted  History of diabetes - CBGs  History of lung cancer s/p resection - Outpatient follow-up if survives  Severe malnutrition secondary to chronic illness - Wound care on board  Goals of care: - Has poor prognosis for meaningful recovery - She is currently DNR but family not ready for comfort care. Disagreement between sons.  - Palliative care and ethics have been consulted.   Dr.  Gaynell Face has discussed at length with sons Shaun from [place] and [place].  Shaun is realistic about the outcome and is advocating comfort measures [name] wants full support including trach and PEG if needed with active seizure activity feel this pt meets medical futility.   Best Practice: (right click and "Reselect all SmartList Selections" daily)  Diet:  Tube Feed  Pain/Anxiety/Delirium protocol (if indicated): Yes (RASS goal -1) VAP protocol (if indicated): Yes DVT prophylaxis: LMWH GI prophylaxis: PPI Glucose control:  SSI No Central venous access:  N/A Arterial line:  N/A Foley:  Yes, and it is still needed due to sacral ulcer Mobility:  bed rest  PT consulted: N/A Last date of multidisciplinary goals of care discussion [6/3] Code Status:  DNR Disposition: ICU Family: sons  Signature:    Critical care time 38 mins   Joneen Roach, AGACNP-BC [facility]  See Amion for personal pager PCCM on call pager [phone] until 7pm. Please call Elink 7p-7a. [phone]  02/20/2021 8:51 AM

## 2021-02-20 NOTE — Ethics Note (Signed)
Ethics Consult Note  Initial contact w/ medical team 02/17/21 which was on patient's hospital day 10  Source of Consult: Dr Briant Sites, DO - Critical Care Current attending physician/service: Chilton Greathouse, MD  Reason(s) for consult and ethical question(s): 1) appropriate surrogate decision-maker: patient has 2 adult sons whose wishes are in conflict with one another 2) autonomy: unknown what course of action patient would have preferred in this instance  3) question of medical futility    Information-gathering: Discussion with source of consult Discussed w/ Dr Isaiah Serge today 02/20/21 - neurology has been re-consulted to give an opinion re: prognosis  Chart review 02/20/21   Narrative:  Medical facts: Ms/ Corwin is an unfortunate 75 year old lady with multiple significant medical problems including history of HTN, DM2, lunch cancer s/p resection, demential, failure to thrive. She presented to ED s/p cardiac arrest at home, initial CPR delivered by her son. Patient has been in status epilepticus despite treatment, and neurology has signed off stating no further interventions available.  Patient's Personal/Social Facts: Sons Shaun and [name] are Retail buyer. Shaun wants comfort measures and to allow for natural death, he is reluctant to argue with his brother [name] who is primary caretaker and wants aggressive life-sustaining treatment. [name] has been banned from visiting the unit, see notes - he was attempting to interfere with medical care by administering some sort of water/juice to the patient. He has history of being verbally abusive to medical team.    With regard to the applicable underlying ethical principles, the standards of ethical care and relevant resources were discussed directly with the attending physician today 02/20/21.  Ethics committee strives to ensure that all necessary and appropriate steps are taken such that all decisions made for this  patient are ethically justifiable. Ethics offers the following recommendations:     Recommendations:  1) Surrogate decision maker(s) in absence of advanced directive: consensus between reasonably available adult children has not been achieved. Sons are in direct opposition re: their wishes as to how to proceed, one desires comfort care and one desires aggressive treatment. The son who desires aggressive treatment however has demonstrated himself to be hostile toward staff and a threat to the patient's safety - he may not have a good understanding of her medical condition such that he can provide informed consent to allow or refuse any given treatments. Legally however, he has not been determined to be incompetent.   2) If earnest, good-faith attempts have been made to achieve shared decision-making between the medical team and the patient's family/surrogate decision-maker(s), but shared decision-making is not possible (in other words, family/surrogate continue to disagree with each other and/or disagree with medical recommendations), then the attending physician (in cooperation with at least one other physician consultant) is ethically justified in withdrawing or in not offering treatments IF those physicians are convinced that such measures are futile, i.e. will not improve patient's QOL, will serve no medical benefit, or may in fact cause harm. Neurology has been asked to provide an opinion regarding Ms. Brandi's condition, intractable seizures    3) If the family/surrogate(s) insists upon treatments which the medical team considers futile, they have the right to attempt transfer to a facility that agrees to provide those treatments. (Of note, there is no designated time allowance frame for transfer, but it is advised to set clear expectations as much as possible. Family ought to be making demonstrable efforts at transfer if transfer is desired. It is reasonable for attending physicians to use  their  best judgment to determine deadlines for transfer, or deadlines for withdrawal of life support if transfer attempts are not successful. It is reasonable to keep patients on life support for a limited time to allow family to gather at bedside prior to withdrawal of life support.)    4) If there appears to be room to attempt shared decision making with the patient's surrogates with the involvement of Ethics, and so Ethics will strive to make 2+ committee members available for a meeting between Ethics representatives, the medical team and the patient's decision-makers to explore this more. Unit manager will be asked to arrange a time/place that is agreeable to all relevant parties. This meeting should limit its attendees to the attending physician, relevant consultants including members of the ethics committee, relevant nursing staff and other medical staff as appropriate, and patient's legal surrogate decision-maker(s) or other support parties who are authorized to receive HIPAA-protected information.           Thank you for this consult. Ethics will continue to follow this case.   Dr. Isaiah Serge has my personal cell phone number and has my permission to share this number at their discretion.  Secure message on Epic is also welcome but may not receive an immediate response.  Please reference AMION for on-call committee member if needed.    Sunnie Nielsen [facility] Ethics Committee

## 2021-02-20 NOTE — Progress Notes (Signed)
    Progress Note from the Palliative Medicine Team at [facility]   Patient Name: Anna Acosta        Date: 02/20/2021 DOB: July 19, 1946  Age: 75 y.o. MRN#: [id number] Attending Physician: Chilton Greathouse, MD Primary Care Physician: Default, Provider, MD Admit Date: 02/13/2021   Medical records reviewed   75 y.o. female  admitted on 01/25/2021 with PMH of hypertension, diabetes, severe dementia, prior lung cancer with an apparent left upper lobe resection, prior subdural hematoma (per her son's history).   She has failure to thrive has been largely dependent for her care, bedbound and has a large deep sacral decubitus ulcer. Cared for in the home by son/Steven  She experienced cardiopulmonary arrest 5/24 while her son was delivering care.  He immediately started CPR. She was in asystole when telemetry placed, 8 more minutes of CPR. She had a King airway that was exchanged to an ET tube when she arrived to the emergency department.   MR Brain 02-12-21 IMPRESSION: 1. Symmetric abnormal diffusion and T2/FLAIR signal abnormality involving the basal ganglia, thalami, perirolandic cortices, and cerebellum. Findings consistent with anoxic brain injury. No significant mass effect. 2. Subacute subdural hematoma overlying the right cerebral convexity measuring up to 1.3 cm with associated trace right-to-left shift. 3. Advanced temporal lobe predominant cerebral atrophy with underlying chronic microvascular ischemic disease. 4. Loss of normal flow void within the left V4 segment, likely [place].  Patient remains intubated, and status epilepticus despite medical interventions.  Brain MRI from 02/12/2021 findings consistent with anoxic brain injury.  Long term poor prognosis  Family face treatment options decisions, advanced directive decisions and anticipatory care needs.   This NP has attempted multiple times to contact son/Steven without success. II tried to contact by phone  again today .   According to medical records [name] was asked to leave the bedside yesterday 2/2 to behaviors, and attempting to administer unapproved substances.    Today I spoke to [facility] regarding his mother's situation.   He understands the seriousness of the situation and the poor prognosis.  He has been open to updates from the attending team and is tells me " "I am in agreement with the doctors".  He tells me he would never want his mother to have a trach or PEG or to be moved from this hospital institution.         However he and his brother have a challenging relationship making joint decision making difficult.   Legally both sons have equal medical decision making rights in this situation.    Son/ Gregary Signs does not wish to defer his rights to his brother at this time.    Ongoing education/Can come for sedation had regarding decision specific to ongoing life prolonging measures and appropriateness of those interventions.  Collaborative discussion  had with the attending team, Dr Isaiah Serge and Joneen Roach NP  Regarding current treatment plan and steps moving forward.  We discussed the details of the hospital's  medical futility policy.  Questions and concerns addressed to the best of my ability.  Total time spent on the unit was 60  minutes  Greater than 50% of the time was spent in counseling and coordination of care  Lorinda Creed NP  Palliative Medicine Team Team Phone # [phone] Pager [phone]

## 2021-02-21 LAB — CBC
HCT: 24.2 % — ABNORMAL LOW (ref 36.0–46.0)
Hemoglobin: 7.1 g/dL — ABNORMAL LOW (ref 12.0–15.0)
MCH: 23.9 pg — ABNORMAL LOW (ref 26.0–34.0)
MCHC: 29.3 g/dL — ABNORMAL LOW (ref 30.0–36.0)
MCV: 81.5 fL (ref 80.0–100.0)
Platelets: 392 10*3/uL (ref 150–400)
RBC: 2.97 MIL/uL — ABNORMAL LOW (ref 3.87–5.11)
RDW: 22.9 % — ABNORMAL HIGH (ref 11.5–15.5)
WBC: 11.8 10*3/uL — ABNORMAL HIGH (ref 4.0–10.5)
nRBC: 0 % (ref 0.0–0.2)

## 2021-02-21 LAB — BASIC METABOLIC PANEL
Anion gap: 11 (ref 5–15)
BUN: 79 mg/dL — ABNORMAL HIGH (ref 8–23)
CO2: 25 mmol/L (ref 22–32)
Calcium: 8.3 mg/dL — ABNORMAL LOW (ref 8.9–10.3)
Chloride: 113 mmol/L — ABNORMAL HIGH (ref 98–111)
Creatinine, Ser: 0.96 mg/dL (ref 0.44–1.00)
GFR, Estimated: >60 mL/min (ref >60)
Glucose, Bld: 131 mg/dL — ABNORMAL HIGH (ref 70–99)
Potassium: 3.8 mmol/L (ref 3.5–5.1)
Sodium: 149 mmol/L — ABNORMAL HIGH (ref 135–145)

## 2021-02-21 LAB — GLUCOSE, CAPILLARY
Glucose-Capillary: 103 mg/dL — ABNORMAL HIGH (ref 70–99)
Glucose-Capillary: 103 mg/dL — ABNORMAL HIGH (ref 70–99)
Glucose-Capillary: 104 mg/dL — ABNORMAL HIGH (ref 70–99)
Glucose-Capillary: 104 mg/dL — ABNORMAL HIGH (ref 70–99)
Glucose-Capillary: 111 mg/dL — ABNORMAL HIGH (ref 70–99)
Glucose-Capillary: 87 mg/dL (ref 70–99)

## 2021-02-21 LAB — PROCALCITONIN: Procalcitonin: 0.9 ng/mL

## 2021-02-21 LAB — PHOSPHORUS: Phosphorus: 3.1 mg/dL (ref 2.5–4.6)

## 2021-02-21 LAB — TRIGLYCERIDES: Triglycerides: 23 mg/dL (ref <150)

## 2021-02-21 LAB — MAGNESIUM: Magnesium: 2 mg/dL (ref 1.7–2.4)

## 2021-02-21 NOTE — Progress Notes (Signed)
Subjective: Called to see patient again after consult to comment on patient's neuro prognostication.  Patient currently on propofol, continues to have myoclonic seizures after stimulation  ROS: Unable to obtain due to poor mental status  Examination  Vital signs in last 24 hours: Temp:  [94.5 F (34.7 C)-100.1 F (37.8 C)] 97.7 F (36.5 C) (06/07 1117) Pulse Rate:  [77-111] 83 (06/07 1132) Resp:  [16-20] 18 (06/07 1132) BP: (88-139)/(50-71) 122/62 (06/07 1132) SpO2:  [95 %-100 %] 100 % (06/07 1132) FiO2 (%):  [30 %] 30 % (06/07 1132) Weight:  [52.5 kg] 52.5 kg (06/07 0500)  General: lying in bed,myoclonic seizures after stimulation CVS: pulse-normal rate and rhythm YK:[id number], initiating breaths on vent Extremities:Warm, edematous Neuro:on propofol @40mcg /hr, [name], does not open eyes to noxious stimuli, pupils equal and reacting to light,, corneal reflex present, gag reflex absent, withdraws to noxious stimuli in all 4 extremities, myoclonic seizures predominate involving right side of face and right upper extremity after stimulation  Basic Metabolic Panel: Recent Labs  Lab 02/17/21 0214 02/18/21 0208 02/19/21 0118 02/20/21 0152 02/21/21 0156  NA 142 143 143 148* 149*  K 4.0 3.9 3.6 3.7 3.8  CL 109 109 111 114* 113*  CO2 25 27 26 27 25   GLUCOSE 112* 133* 143* 126* 131*  BUN 65* 68* 71* 63* 79*  CREATININE 1.26* 1.09* 1.01* 0.94 0.96  CALCIUM 8.0* 8.3* 8.1* 8.1* 8.3*  MG 2.1  --   --  2.0 2.0  PHOS 1.4* 2.2* 2.5  --  3.1    CBC: Recent Labs  Lab 02/16/21 0923 02/17/21 0214 02/19/21 0118 02/20/21 0152 02/21/21 0156  WBC 9.8 10.6* 11.4* 11.8* 11.8*  HGB 8.0* 7.9* 7.2* 7.6* 7.1*  HCT 25.4* 25.6* 23.7* 24.9* 24.2*  MCV 76.0* 77.8* 78.7* 79.8* 81.5  PLT 221 263 297 333 392     Coagulation Studies: No results for input(s): LABPROT, INR in the last 72 hours.  Imaging No new brain imaging overnight  ASSESSMENT AND PLAN: 75 year old female with past  medical history of hypertension, diabetes, dementia, prior lung cancer, prior subdural hematoma, failure to thrive, sacral ulcer who presented after cardiac arrest with about 8 minutes of CPR before ROSC. Patient was noted to have myoclonic seizures which have been resistant to Keppra and Depakote.  Suspected anoxic brain injury Myoclonic seizures -Patient continues to have myoclonic seizures after stimulation even on propofol @40mcg /hr, keppra 1000mg  BID and depakote 250mg  BID  Recommendations -Ms. Mulgrew is a 82 female with multiple medical comorbidities as noted above, out of hospital cardiac arrest with prolonged downtime, myoclonic seizures on arrival which has been difficult to control with AEDs as well as sedation, MRI brain consistent with anoxic brain injury.  She has been in a comatose state since arrival ( 2 weeks) with only brain stem reflexes and minimal withdraw to noxious stimulation.  -I suspect patient has suffered significant neurological injury with minimal to NO chances of meaningful neurologic recovery.   I have spent a total of48minuteswith the patient reviewing hospitalnotes, test results, labs and examining the patient as well as establishing an assessment and plan.>50% of time was spent in direct patient care.   Epilepsy [facility] For questions after 5pm please refer to AMION to reach the Neurologist on call

## 2021-02-21 NOTE — Progress Notes (Signed)
NAME:  Anna Acosta MRN:  [id number] DOB:  08/20/1946 LOS: 14 ADMISSION DATE:  2021-02-12 CONSULTATION DATE:  2021-02-12 REFERRING MD:  Myrtis Ser CHIEF COMPLAINT:  Cardiac arrest   History of Present Illness:  75 year old chronically unwell female with PMHx significant for HTN, T2DM, lung cancer (s/p resection), dementia and FTT who presented to [facility] ED 5/24 via EMS s/p cardiac arrest. Patient's son was reportedly caring for her sacral decubitus ulcer when he turned her back over and realized she was not breathing/pulseless. Patient's son immediately started CPR. Initial rhythm on first responder arrival was asystole with completion of CPR x 2 rounds (8 total minutes) and Epi x 1 with ROSC. King airway was placed in the field, exchanged to ETT. Low-dose Levophed was initiated.  On arrival to ED, labs notable for WBC 15K, H&H 8.0/28.9, Plt 515. Na 130, K 4.4, CO2 17, BUN 6, Cr 1.15. Trop 19, LA 7.9. ABG 7.370/54.8/546/27.4. CXR demonstrating masslike opacity in L lung apex. CT Head demonstrating mixed density R subdural collection (acute/chronic) with mild mass effect, slight < 23mm R to L midline shift.  PCCM consulted for admission.  Pertinent Medical History:  HTN, T2DM, dementia, lung CA (s/p resection), FTT  Significant Hospital Events: Including procedures, antibiotic start and stop dates in addition to other pertinent events   . 5/24 - BIB EMS s/p cardiac arrest; CPR x 2 rounds (8 total minutes) and Epi x 1 with ROSC CT Head with mixed acute/chronic subdural collection with mass effect/slight midline shift. Elevated LA. On low dose pressors . 5/29 off pressors. . 5/30 propofol resumed for clinical seizures/lip twitching. Neuro signed off citing minimal to no chance for a neurologic recovery.  . 6/2 Myoclonic seizures ongoing despite maximal therapy including propofol. . 6/3 Ethics consult for medical futility.  . 6/5 Son [name] is verbally aggressive with staff, attempting to administer  unapproved substances and barred from entering the hospital  Interim History / Subjective:   Continues to have poor mental status, on propofol for ongoing seizures No acute events overnight  Objective:  Blood pressure (!) 106/53, pulse 93, temperature 98.4 F (36.9 C), temperature source Axillary, resp. rate 18, height 5' 4.02" (1.626 m), weight 52.5 kg, SpO2 100 %.    Vent Mode: PRVC FiO2 (%):  [30 %] 30 % Set Rate:  [18 bmp] 18 bmp Vt Set:  [430 mL] 430 mL PEEP:  [5 cmH20] 5 cmH20 Plateau Pressure:  [17 cmH20-24 cmH20] 19 cmH20   Intake/Output Summary (Last 24 hours) at 02/21/2021 0835 Last data filed at 02/21/2021 1324 Gross per 24 hour  Intake 1744.97 ml  Output 1400 ml  Net 344.97 ml   Filed Weights   02/18/21 0341 02/20/21 0500 02/21/21 0500  Weight: 56.2 kg 54.9 kg 52.5 kg   Physical Examination:  Gen:      Frail, elderly HEENT:  EOMI, sclera anicteric Neck:     No masses; no thyromegaly ETT Lungs:    Clear to auscultation bilaterally; normal respiratory effort CV:         Regular rate and rhythm; no murmurs Abd:      + bowel sounds; soft, non-tender; no palpable masses, no distension Ext:    No edema; adequate peripheral perfusion Skin:      Warm and dry; no rash Neuro: Unresponsive   Labs/imaging that I have personally reviewed: (right click and "Reselect all SmartList Selections" daily)   LTM EEG 5/26 worsening myoclonic seizures nowconsistent with myoclonic status epilepticus  MRI brain 5/29  Symmetric abnormal diffusion and T2/FLAIR signal abnormality involving the basal ganglia, thalami, perirolandic cortices, and cerebellum. Findings consistent with anoxic brain injury Subacute subdural hematoma overlying the right cerebral convexity measuring up to 1.3 cm with associated trace right-to-left shift. Advanced temporal lobe predominant cerebral atrophy    Sodium increased at 149, hemoglobin 7.1.  No new imaging  Resolved Hospital Problem List:      Assessment & Plan:   [name] who experienced cardiopulmonary arrest 5/24 while son was delivering care, ROSC achieved after 8 minutes of CPR. She was intubated and started on pressor support. CT head showed acute on chronic right sided subdural hematoma and subsequently found to be having a myoclonic seizure.    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   Acute cardiopulmonary arrest: Etiology unclear. LVEF intact, b ut diastolic dysfunction noted on echo. HTN -Labetalol and low dose acei  Acute hypoxic respiratory failure  Continue vent support, failing SBT's due to poor mental status  Severe sepsis, present on admissionFinished Zosyn.   Has been intermittently febrile but no evidence of active infection with low PCT and normal WBC count Fevers likely due to neurologic source Observe off antibiotics  AKI, Labs improving, Urine output adequate Hypernatremia Increase free water  Anemia of critical illness and chronic disease Follow CBC  LUE swelling, unilateral - Doppler extremity negative for DVT - Supportive care.   Sacral decubitus ulcer, present on admission Wound care  History of diabetes - CBGs  History of lung cancer s/p resection - Outpatient follow-up if survives  Severe malnutrition secondary to chronic illness Nutrition  Goals of care: - Has poor prognosis for meaningful recovery - She is currently DNR but family not ready for comfort care. Disagreement between sons.  - Palliative care and ethics have been consulted.   Discussed with Lyn Hollingshead from [employer].  We have consulted neuro again for prognostication.  If we all agree  that she has no chance of meaningful recovery then we may need to proceed to comfort care as further treatments such as trach and PEG are not effective  Best Practice: (right click and "Reselect all SmartList Selections" daily)  Diet:  Tube Feed  Pain/Anxiety/Delirium protocol (if indicated): Yes (RASS goal -1) VAP protocol (if indicated): Yes DVT prophylaxis: LMWH GI prophylaxis: PPI Glucose control:  SSI No Central venous access:  N/A Arterial line:  N/A Foley:  Yes, and it is still needed due to sacral ulcer Mobility:  bed rest  PT consulted: N/A Last date of multidisciplinary goals of care discussion [6/3] Code Status:  DNR Disposition: ICU Family: sons  Signature:    The patient is critically ill with multiple organ system failure and requires high complexity decision making for assessment and support, frequent evaluation and titration of therapies, advanced monitoring, review of radiographic studies and interpretation of complex data.   Critical Care Time devoted to patient care services, exclusive of separately billable procedures, described in this note is 35 minutes.   Chilton Greathouse MD [place] See Amion for pager  If no response to pager , please call [phone] until 7pm After 7:00 pm call Elink  [phone] 02/21/2021, 8:35 AM

## 2021-02-22 DIAGNOSIS — Z978 Presence of other specified devices: Secondary | ICD-10-CM

## 2021-02-22 LAB — GLUCOSE, CAPILLARY
Glucose-Capillary: 101 mg/dL — ABNORMAL HIGH (ref 70–99)
Glucose-Capillary: 103 mg/dL — ABNORMAL HIGH (ref 70–99)
Glucose-Capillary: 114 mg/dL — ABNORMAL HIGH (ref 70–99)
Glucose-Capillary: 126 mg/dL — ABNORMAL HIGH (ref 70–99)
Glucose-Capillary: 88 mg/dL (ref 70–99)
Glucose-Capillary: 99 mg/dL (ref 70–99)

## 2021-02-22 LAB — BASIC METABOLIC PANEL
Anion gap: 8 (ref 5–15)
BUN: 99 mg/dL — ABNORMAL HIGH (ref 8–23)
CO2: 27 mmol/L (ref 22–32)
Calcium: 8.4 mg/dL — ABNORMAL LOW (ref 8.9–10.3)
Chloride: 111 mmol/L (ref 98–111)
Creatinine, Ser: 0.98 mg/dL (ref 0.44–1.00)
GFR, Estimated: >60 mL/min (ref >60)
Glucose, Bld: 102 mg/dL — ABNORMAL HIGH (ref 70–99)
Potassium: 3.8 mmol/L (ref 3.5–5.1)
Sodium: 146 mmol/L — ABNORMAL HIGH (ref 135–145)

## 2021-02-22 LAB — CBC
HCT: 23.4 % — ABNORMAL LOW (ref 36.0–46.0)
Hemoglobin: 7 g/dL — ABNORMAL LOW (ref 12.0–15.0)
MCH: 24.1 pg — ABNORMAL LOW (ref 26.0–34.0)
MCHC: 29.9 g/dL — ABNORMAL LOW (ref 30.0–36.0)
MCV: 80.4 fL (ref 80.0–100.0)
Platelets: 409 10*3/uL — ABNORMAL HIGH (ref 150–400)
RBC: 2.91 MIL/uL — ABNORMAL LOW (ref 3.87–5.11)
RDW: 23.2 % — ABNORMAL HIGH (ref 11.5–15.5)
WBC: 12 10*3/uL — ABNORMAL HIGH (ref 4.0–10.5)
nRBC: 0 % (ref 0.0–0.2)

## 2021-02-22 LAB — PHOSPHORUS: Phosphorus: 3.7 mg/dL (ref 2.5–4.6)

## 2021-02-22 LAB — MAGNESIUM: Magnesium: 2.4 mg/dL (ref 1.7–2.4)

## 2021-02-22 MED ORDER — MIDAZOLAM HCL 2 MG/2ML IJ SOLN
1.0000 mg | INTRAMUSCULAR | Status: DC | PRN
  Administered 2021-02-22 (×3): 2 mg via INTRAVENOUS
  Filled 2021-02-22 (×4): qty 2

## 2021-02-22 NOTE — Progress Notes (Signed)
Nutrition Follow-up  DOCUMENTATION CODES:   Severe malnutrition in context of chronic illness  INTERVENTION:   Continue tube feeding via OG tube: - Vital AF 1.2 @ 50 ml/hr (1200 ml/day) - Free water flushes per CCM, currently 200 ml q 6 hours  Tube feeding regimen provides 1440 kcal, 90 grams of protein, and 973 ml of H2O. Total free water with flushes: 1773 ml  Tube feeding regimen and current propofol provides 1736 total kcal.  - Continue MVI with minerals daily per tube  - Continue 1 packet Juven BID, each packet provides 95 calories, 2.5 grams of protein, and 9.8 grams of carbohydrate; also contains L-arginine and L-glutamine, vitamin C, vitamin E, vitamin B-12, zinc, calcium, and calcium Beta-hydroxy-Beta-methylbutyrate to support wound healing  NUTRITION DIAGNOSIS:   Severe Malnutrition related to chronic illness (dementia) as evidenced by severe muscle depletion,severe fat depletion.  Ongoing, being addressed via TF  GOAL:   Patient will meet greater than or equal to 90% of their needs  Met via TF  MONITOR:   Vent status,Labs,Weight trends,TF tolerance,Skin,I & O's  REASON FOR ASSESSMENT:   Consult Enteral/tube feeding initiation and management  ASSESSMENT:   74-year-old female who presented to the ED on 5/24 after a witnessed cardiac arrest. PMH of HTN, T2DM, dementia, prior lung cancer s/p LUL resection, prior SDH, sacral decubitus ulcer. Pt was intubated in the field.  Noted events over the weekend when pt's son Steven attempted to administer unapproved substances (alkaline water and pureed vegetables) to pt. Pt's son Steven has now been barred from entering the hospital.  Per Neurology, pt with minimal to no change for neurologic recovery. Pt is not a candidate for trach, PEG. Discussed pt with RN and during ICU rounds. Plan is to transition to comfort measures on Saturday if pt's son unable to find another hospital that will accept pt as a  transfer.  Admit weight: 47.4 kg Current weight: 53.5 kg  Weight up compared to admission weight. Per nursing documentation, pt with very deep pitting edema to BUE and mild pitting edema to BLE.  Patient is currently intubated on ventilator support MV: 8.0 L/min Temp (24hrs), Avg:98 F (36.7 C), Min:96.4 F (35.8 C), Max:99.5 F (37.5 C)  Drips: Propofol: 11.2 ml/hr (provides 296 kcal daily from lipid)  Medications reviewed and include: MVI with minerals daily, Juven BID, protonix  Labs reviewed: sodium 146, BUN 99, hemoglobin 7.0 CBG's: 87-111 x 24 hours  UOP: 960 ml x 24 hours I/O's: +17.5 L since admit  Diet Order:   Diet Order            Diet NPO time specified  Diet effective now                 EDUCATION NEEDS:   No education needs have been identified at this time  Skin:  Skin Assessment: Skin Integrity Issues: Stage II: R thigh Stage IV: sacrum  Last BM:  02/21/21  Height:   Ht Readings from Last 1 Encounters:  02/12/2021 5' 4.02" (1.626 m)    Weight:   Wt Readings from Last 1 Encounters:  02/22/21 53.5 kg    BMI:  Body mass index is 20.24 kg/m.  Estimated Nutritional Needs:   Kcal:  1400-1600  Protein:  75-90 grams  Fluid:  >1.5 L/day    Kate , MS, RD, LDN Inpatient Clinical Dietitian Please see AMiON for contact information.  

## 2021-02-22 NOTE — Progress Notes (Signed)
PCCM note  Palliative care and I discussed with sons regarding consensus from the medical teams regarding prognosis for recovery.  There is minimal to no chance of meaningful recovery as she has ongoing myoclonic seizures, severe anoxic injury  She is chronically malnourished, debilitated at baseline with stage IV decubitus ulcer on admission.  We have consulted with Dr. Lyn Hollingshead from ethics and we agree that further medical care including trach, PEG is not medically effective and will only prolong suffering.  Shaun the elder son agrees with this decision and feels that this is the best option for his mother.  [name] over the telephone today has become angry, belligerent and is accusing [place] of not giving her the right medications including fruits and vegetables to heal her brain.  We made multiple attempts to explain the situation to him but there is no understanding.  [name] was told that we plan on compassionate extubation on 6/11 at noon.  He has been given the chance to arrange transfer to a different facility if he can.   The patient is critically ill with multiple organ system failure and requires high complexity decision making for assessment and support, frequent evaluation and titration of therapies, advanced monitoring, review of radiographic studies and interpretation of complex data.   Critical Care Time devoted to patient care services, exclusive of separately billable procedures, described in this note is 40 minutes.   Chilton Greathouse MD [place] [facility] See Amion for pager  If no response to pager , please call [phone] until 7pm After 7:00 pm call Elink  [phone] 02/22/2021, 3:52 PM

## 2021-02-22 NOTE — Ethics Note (Signed)
Ethics follow-up note:   Background:  Physicians have a duty to evaluate and discuss the use of available medical interventions with their patients. The well-being of the whole person must be taken into account in deciding about any therapeutic intervention or use of technology. Therapeutic procedures that are likely to cause harm or undesirable side-effects can be justified only by a proportionate benefit Anna Acosta is anticipated] to the patient. Ethical medical decision-making supports the practice of physicians to evaluate, discuss, and offer to patients only those interventions that are medically appropriate and sufficiently beneficial in helping to attain a patient's goals of care. Physicians are to respect the wishes of the patient, to the extent possible, while preserving the integrity of the treatment team caring for the patient, - therefore, non-beneficial, medically inappropriate, and medically contraindicated treatments should NOT be offered, provided, or continued.  I spoke again today with Dr Isaiah Serge and I have reviewed the medical record. Ethics offers the following recommendations:   After thorough conversation and review, Ethics supports the decision to invoke the Medical Futility Policy.   Ethics has confirmed that the attending physician, Dr. Isaiah Serge, has spoken directly to the appropriate surrogate decision makers for Anna Acosta, her two adult sons, regarding her care. Dr. Isaiah Serge and other physicians/providers have made multiple attempts to explain the patient's medical condition in detail, particularly the unlikelihood of meaningful recovery. Ethics consultant agrees that the possibility of joint decision-making has been exhausted and the appropriate decision-making process has been carefully followed per policy - multiple reports in medical record of consistent attitudes/wishes of the patient's sons, including their disagreement with one another over the next appropriate steps.   There has  been cooperation with appropriate consultants including Neurology, and this consult team agrees and has documented that the patient's prognosis is grim and she does not have a meaningful chance of recovery. In summary: all health care providers agree that aggressive treatment (indcluding but not limited to resusitative measures, tracheostomy, prolonged artificial feeding) is futile.   It is ethically appropriate for the health care team to go forward with unilateral decision-making. The attending of record, Dr. Isaiah Serge, understands and has followed this process appropriately.   The patient/proxy should be informed of treatment decisions and informed of their option of transfer to another institution (this has been discussed already with the sons). In such a situation, it is the responsibility of the patient/proxy to find an accepting institution in a timely fashion and it is the responsibility of the physician and [place] to support that transfer to the receiving institution. If transfer is not possible, the treatment should not be offered or continued. Life-sustaining measures may reasonably be continued in order to allow time for family to gather at bedside.

## 2021-02-22 NOTE — Progress Notes (Signed)
Progress Note from the Palliative Medicine Team at [facility]   Patient Name: Anna Acosta        Date: 02/22/2021 DOB: [birth year]  Age: 75 y.o. MRN#: [id number] Attending Physician: Chilton Greathouse, MD Primary Care Physician: Default, Provider, MD Admit Date: Feb 25, 2021   Medical records reviewed   75 y.o. female  admitted on Feb 25, 2021 with PMH of hypertension, diabetes, severe dementia, prior lung cancer with an apparent left upper lobe resection, prior subdural hematoma (per her son's history).   She has failure to thrive has been largely dependent for her care, bedbound and has a large deep sacral decubitus ulcer. Cared for in the home by son/Anna Acosta  She experienced cardiopulmonary arrest 5/24 while her son was delivering care.  He immediately started CPR. She was in asystole when telemetry placed, 8 more minutes of CPR. She had a King airway that was exchanged to an ET tube when she arrived to the emergency department.   MR Brain 02-12-21 IMPRESSION: 1. Symmetric abnormal diffusion and T2/FLAIR signal abnormality involving the basal ganglia, thalami, perirolandic cortices, and cerebellum. Findings consistent with anoxic brain injury. No significant mass effect. 2. Subacute subdural hematoma overlying the right cerebral convexity measuring up to 1.3 cm with associated trace right-to-left shift. 3. Advanced temporal lobe predominant cerebral atrophy with underlying chronic microvascular ischemic disease. 4. Loss of normal flow void within the left V4 segment, likely [place].  Patient remains intubated, and status epilepticus despite medical interventions.  Brain MRI from 02/12/2021 findings consistent with anoxic brain injury.  Per Neurology patient has suffered significant neurological injury with minimal to NO chances of meaningful neurologic recovery Medical team in agreement,  that patient has no chance of meaningful recovery and that she is not a candidate for  trach and PEG; these interventions would be inappropriate.  Anna Acosta was asked to leave the bedside on 02-19-21 to inappropriate  behaviors, and attempting to administer unapproved substances.  Security was notified.  Ethics has been consulted.  There are no  documented healthcare power of attorney or advanced care planning documents.  Patient has 2 sons, both sons have equal medical decision making rights in this situation.     Education has been offered to both sons regarding the patient's medical situation from the healthcare team.  Anna Acosta /Anna Acosta verbalizes an understanding of his mothers serious medical situation and associated poor prognosis.  He is in agreement with a shift to comfort allowing for natural death.  However son/Stephen is unaccepting of this information.  Today by telephone Dr. Isaiah Serge and myself spoke to [facility].  Conversation quickly escalated to [employer] accusations that " you are not giving her fruits and vegetables like I am asking you to do.  Simply blend it up and put it in her feeding bag.  I have given her this in the past and it has healed her brain.  Her organs are working  the brain does not control everything."  Multiple attempts at education regarding the medical situation and the significance of the patient's brain injury.  Unable to calm down.     Dr. Isaiah Serge educated patient's son Anna Acosta on the fact that he had the right to arrange transfer outside the institution if he desires.  He is given him a 3-day window and has  notified him that if transfer is not completed patient will be liberated from the ventilator on Saturday at 12:00.  I then spoke to son Anna Acosta by phone and reviewed the above with him.  He  tells me that he will return to [facility] on Friday evening and will be present on Saturday morning at the bedside.   Although he expresses sadness he verbalizes "I do not want my mother to suffer anymore".  He is in agreement with shift to comfort, and does not believe  moving his moving woulds "be the right thing to do".  Education offered that it is extremely unlikely that any institution would accept transfer on this patient at this time in her current medical situation.  Emotional support offered.    He hopes to be able to speak with and support his brother.  This nurse practitioner informed  the family and the attending that I will be out of the hospital until Monday morning.   Call palliative medicine team phone # [phone] with questions or concerns in the interim.   Total time spent on the unit was 60  minutes  Greater than 50% of the time was spent in counseling and coordination of care  Lorinda Creed NP  Palliative Medicine Team Team Phone # [phone] Pager [phone]

## 2021-02-22 NOTE — Progress Notes (Signed)
NAME:  Anna Acosta MRN:  [id number] DOB:  08-02-1946 LOS: 15 ADMISSION DATE:  Feb 20, 2021 CONSULTATION DATE:  Feb 20, 2021 REFERRING MD:  Myrtis Ser CHIEF COMPLAINT:  Cardiac arrest   History of Present Illness:  75 year old chronically unwell female with PMHx significant for HTN, T2DM, lung cancer (s/p resection), dementia and FTT who presented to [facility] ED 5/24 via EMS s/p cardiac arrest. Patient's son was reportedly caring for her sacral decubitus ulcer when he turned her back over and realized she was not breathing/pulseless. Patient's son immediately started CPR. Initial rhythm on first responder arrival was asystole with completion of CPR x 2 rounds (8 total minutes) and Epi x 1 with ROSC. King airway was placed in the field, exchanged to ETT. Low-dose Levophed was initiated.  On arrival to ED, labs notable for WBC 15K, H&H 8.0/28.9, Plt 515. Na 130, K 4.4, CO2 17, BUN 6, Cr 1.15. Trop 19, LA 7.9. ABG 7.370/54.8/546/27.4. CXR demonstrating masslike opacity in L lung apex. CT Head demonstrating mixed density R subdural collection (acute/chronic) with mild mass effect, slight < 41mm R to L midline shift.  PCCM consulted for admission.  Pertinent Medical History:  HTN, T2DM, dementia, lung CA (s/p resection), FTT  Significant Hospital Events: Including procedures, antibiotic start and stop dates in addition to other pertinent events   . 5/24 - BIB EMS s/p cardiac arrest; CPR x 2 rounds (8 total minutes) and Epi x 1 with ROSC CT Head with mixed acute/chronic subdural collection with mass effect/slight midline shift. Elevated LA. On low dose pressors . 5/29 off pressors. . 5/30 propofol resumed for clinical seizures/lip twitching. Neuro signed off citing minimal to no chance for a neurologic recovery.  . 6/2 Myoclonic seizures ongoing despite maximal therapy including propofol. . 6/3 Ethics consult for medical futility.  . 6/5 Son [name] is verbally aggressive with staff, attempting to administer  unapproved substances and barred from entering the hospital . 6/7 neurology consulted.  Agree with assessment that she has minimal to no chance for neurologic recovery  Interim History / Subjective:   Remains on propofol, no change in mental status Continues to have ongoing seizures  Objective:  Blood pressure (!) 91/53, pulse 88, temperature 99 F (37.2 C), temperature source Oral, resp. rate 18, height 5' 4.02" (1.626 m), weight 53.5 kg, SpO2 100 %.    Vent Mode: PRVC FiO2 (%):  [30 %] 30 % Set Rate:  [18 bmp] 18 bmp Vt Set:  [430 mL] 430 mL PEEP:  [5 cmH20] 5 cmH20 Plateau Pressure:  [19 cmH20-21 cmH20] 20 cmH20   Intake/Output Summary (Last 24 hours) at 02/22/2021 0912 Last data filed at 02/22/2021 0800 Gross per 24 hour  Intake 1806.67 ml  Output 925 ml  Net 881.67 ml   Filed Weights   02/20/21 0500 02/21/21 0500 02/22/21 0455  Weight: 54.9 kg 52.5 kg 53.5 kg   Physical Examination:  Blood pressure (!) 91/53, pulse 88, temperature 99 F (37.2 C), temperature source Oral, resp. rate 18, height 5' 4.02" (1.626 m), weight 53.5 kg, SpO2 100 %. Gen:      No acute distress, chronically ill appearing HEENT:  EOMI, sclera anicteric Neck:     No masses; no thyromegaly, ETT Lungs:    Clear to auscultation bilaterally; normal respiratory effort CV:         Regular rate and rhythm; no murmurs Abd:      + bowel sounds; soft, non-tender; no palpable masses, no distension Ext:    No edema; adequate  peripheral perfusion Skin:      Warm and dry; no rash Neuro: Unresponsive   Labs/imaging that I have personally reviewed: (right click and "Reselect all SmartList Selections" daily)   LTM EEG 5/26 worsening myoclonic seizures nowconsistent with myoclonic status epilepticus  MRI brain 5/29 Symmetric abnormal diffusion and T2/FLAIR signal abnormality involving the basal ganglia, thalami, perirolandic cortices, and cerebellum. Findings consistent with anoxic brain injury Subacute subdural  hematoma overlying the right cerebral convexity measuring up to 1.3 cm with associated trace right-to-left shift. Advanced temporal lobe predominant cerebral atrophy   Significant for hemoglobin 7, platelets 409 No new imaging  Resolved Hospital Problem List:     Assessment & Plan:   [name] who experienced cardiopulmonary arrest 5/24 while son was delivering care, ROSC achieved after 8 minutes of CPR. She was intubated and started on pressor support. CT head showed acute on chronic right sided subdural hematoma and subsequently found to be having a myoclonic seizure.    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   Acute cardiopulmonary arrest: Etiology unclear. LVEF intact, b ut diastolic dysfunction noted on echo. HTN -Labetalol and low dose acei  Acute hypoxic respiratory failure  Continue vent support, failing SBT's due to poor mental status  Severe sepsis, present on admissionFinished Zosyn.   Has been intermittently febrile but no evidence of active infection with low PCT and normal WBC count Fevers likely due to neurologic source Observe off antibiotics  AKI, Labs improving, Urine output adequate Hypernatremia Continue free water  Anemia of critical illness and chronic disease Follow CBC  LUE swelling, unilateral - Doppler extremity negative for DVT - Supportive care.   Sacral decubitus ulcer, present on admission Wound care  History of diabetes - CBGs  History of lung cancer s/p resection - Outpatient follow-up if survives  Severe malnutrition secondary to chronic illness Nutrition  Goals of care: - Has poor prognosis for  meaningful recovery - She is currently DNR but family not ready for comfort care. Disagreement between sons.  - Palliative care and ethics have been consulted.   Medical team members agree that she has no chance of meaningful recovery.  Not a candidate for trach, PEG.  Will discuss with palliative and ethics about transitioning to comfort measures  Best Practice: (right click and "Reselect all SmartList Selections" daily)  Diet:  Tube Feed  Pain/Anxiety/Delirium protocol (if indicated): Yes (RASS goal -1) VAP protocol (if indicated): Yes DVT prophylaxis: LMWH GI prophylaxis: PPI Glucose control:  SSI No Central venous access:  N/A Arterial line:  N/A Foley:  Yes, and it is still needed due to sacral ulcer Mobility:  bed rest  PT consulted: N/A Last date of multidisciplinary goals of care discussion [6/3] Code Status:  DNR Disposition: ICU Family: sons  Signature:    The patient is critically ill with multiple organ system failure and requires high complexity decision making for assessment and support, frequent evaluation and titration of therapies, advanced monitoring, review of radiographic studies and interpretation of complex data.   Critical Care Time devoted to patient care services, exclusive of separately billable procedures, described in this note is 35 minutes.   Chilton Greathouse MD [place] [facility] See Amion for pager  If no response to pager , please call [phone] until 7pm After 7:00 pm call Elink  [phone] 02/22/2021, 9:12 AM

## 2021-02-23 LAB — GLUCOSE, CAPILLARY
Glucose-Capillary: 106 mg/dL — ABNORMAL HIGH (ref 70–99)
Glucose-Capillary: 108 mg/dL — ABNORMAL HIGH (ref 70–99)
Glucose-Capillary: 112 mg/dL — ABNORMAL HIGH (ref 70–99)
Glucose-Capillary: 115 mg/dL — ABNORMAL HIGH (ref 70–99)
Glucose-Capillary: 86 mg/dL (ref 70–99)
Glucose-Capillary: 90 mg/dL (ref 70–99)

## 2021-02-23 MED ORDER — MIDAZOLAM 50MG/50ML (1MG/ML) PREMIX INFUSION
0.5000 mg/h | INTRAVENOUS | Status: DC
Start: 2021-02-23 — End: 2021-02-24
  Administered 2021-02-23: 0.5 mg/h via INTRAVENOUS
  Filled 2021-02-23: qty 50

## 2021-02-23 NOTE — Progress Notes (Signed)
Late entry. Pt having hypotension systolic pressure in the 70's.  MAP-55-65.  This RN informed Brett Canales Minor NP and Dr. Isaiah Serge.  No new orders at this time.  Erick Blinks, RN

## 2021-02-23 NOTE — Progress Notes (Signed)
PCCM Progress note  Patient has declining blood pressure through the day.  Currently has a blood pressure of 71/44 She is continuously seizing in spite of Versed and propofol drips  Will not start pressors given grim prognosis and futility of care.  Continue vent for now I called son Shaun over the telephone but did not get an answer  Chilton Greathouse MD [place] [facility] See Amion for pager  If no response to pager , please call [phone] until 7pm After 7:00 pm call Elink  [phone] 02/23/2021, 5:03 PM

## 2021-02-23 NOTE — Progress Notes (Addendum)
NAME:  Anna Acosta MRN:  [id number] DOB:  1945-10-13 LOS: 16 ADMISSION DATE:  02/13/2021 CONSULTATION DATE:  02/12/2021 REFERRING MD:  Myrtis Ser CHIEF COMPLAINT:  Cardiac arrest   History of Present Illness:  75 year old chronically unwell female with PMHx significant for HTN, T2DM, lung cancer (s/p resection), dementia and FTT who presented to [facility] ED 5/24 via EMS s/p cardiac arrest. Patient's son was reportedly caring for her sacral decubitus ulcer when he turned her back over and realized she was not breathing/pulseless. Patient's son immediately started CPR. Initial rhythm on first responder arrival was asystole with completion of CPR x 2 rounds (8 total minutes) and Epi x 1 with ROSC. King airway was placed in the field, exchanged to ETT. Low-dose Levophed was initiated.  On arrival to ED, labs notable for WBC 15K, H&H 8.0/28.9, Plt 515. Na 130, K 4.4, CO2 17, BUN 6, Cr 1.15. Trop 19, LA 7.9. ABG 7.370/54.8/546/27.4. CXR demonstrating masslike opacity in L lung apex. CT Head demonstrating mixed density R subdural collection (acute/chronic) with mild mass effect, slight < 66mm R to L midline shift.  PCCM consulted for admission.  Pertinent Medical History:  HTN, T2DM, dementia, lung CA (s/p resection), FTT  Significant Hospital Events: Including procedures, antibiotic start and stop dates in addition to other pertinent events   5/24 - BIB EMS s/p cardiac arrest; CPR x 2 rounds (8 total minutes) and Epi x 1 with ROSC CT Head with mixed acute/chronic subdural collection with mass effect/slight midline shift. Elevated LA. On low dose pressors 5/29 off pressors. 5/30 propofol resumed for clinical seizures/lip twitching. Neuro signed off citing minimal to no chance for a neurologic recovery.  6/2 Myoclonic seizures ongoing despite maximal therapy including propofol. 6/3 Ethics consult for medical futility.  6/5 Son [name] is verbally aggressive with staff, attempting to administer unapproved  substances and barred from entering the hospital 6/7 neurology consulted.  Agree with assessment that she has minimal to no chance for neurologic recovery 02/22/2021 ethics committee along with neurology critical care Agree this is futile care.  Plan for one-way extubation in the future.  Interim History / Subjective:   Currently on propofol now hypotensive continues to have seizure  Objective:  Blood pressure (!) 82/44, pulse 77, temperature 97.7 F (36.5 C), temperature source Oral, resp. rate 18, height 5' 4.02" (1.626 m), weight 54 kg, SpO2 91 %.    Vent Mode: PRVC FiO2 (%):  [30 %-100 %] 70 % Set Rate:  [18 bmp] 18 bmp Vt Set:  [430 mL] 430 mL PEEP:  [5 cmH20] 5 cmH20 Plateau Pressure:  [21 cmH20-24 cmH20] 21 cmH20   Intake/Output Summary (Last 24 hours) at 02/23/2021 1032 Last data filed at 02/23/2021 0801 Gross per 24 hour  Intake 1727.36 ml  Output 595 ml  Net 1132.36 ml   Filed Weights   02/21/21 0500 02/22/21 0455 02/23/21 0456  Weight: 52.5 kg 53.5 kg 54 kg   Physical Examination:  General: Frail elderly female who is unresponsive HEENT: Endotracheal tube is in place, twitching of right face and jaw was noted Neuro: Remains unresponsive with continuous seizing despite appropriate medications CV: Sounds regular noted to be hypotensive PULM: Decreased breath sounds Vent pressure regulated volume control FIO2 70% PEEP 5 RATE 18 VT 430 cc  GI: soft, bsx4 active  Extremities: warm/dry, 3+ edema  Skin: Sacral ulcer    Labs/imaging that I have personally reviewed: (right click and "Reselect all SmartList Selections" daily)   LTM EEG 5/26 worsening myoclonic  seizures now consistent with myoclonic status epilepticus  MRI brain 5/29 Symmetric abnormal diffusion and T2/FLAIR signal abnormality involving the basal ganglia, thalami, perirolandic cortices, and cerebellum. Findings consistent with anoxic brain injury Subacute subdural hematoma overlying the right cerebral  convexity measuring up to 1.3 cm with associated trace right-to-left shift. Advanced temporal lobe predominant cerebral atrophy   Significant for hemoglobin 7, platelets 409 No new imaging  Resolved Hospital Problem List:     Assessment & Plan:   32 yoF who experienced cardiopulmonary arrest 5/24 while son was delivering care, ROSC achieved after 8 minutes of CPR. She was intubated and started on pressor support. CT head showed acute on chronic right sided subdural hematoma and subsequently found to be having a myoclonic seizure.    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 LVEF intact, b ut diastolic dysfunction noted on echo. No current interventions needed   HTN Labetalol and ACE inhibitor's have stopped 02/23/2021 due to hypotension   hypoxic respiratory failure  Continue vent support, failing SBT's due to poor mental status  Severe sepsis, present on admissionFinished Zosyn.   Has been intermittently febrile but no evidence of active infection with low PCT and normal WBC count Fevers likely due to neurologic source Observe off antibiotics  AKI, Labs improving, Urine output adequate Hypernatremia Recent Labs  Lab 02/20/21 0152 02/21/21 0156 02/22/21 0247  NA 148* 149* 146*    Continue free water  Trend CBC  LUE swelling, unilateral Supportive care  Sacral decubitus ulcer, present on admission Continue wound care  History of diabetes CBG  History of lung cancer s/p resection [place]  Severe malnutrition secondary to chronic illness Continue nutrition  Goals of care: One-way extubation in near future  Medical team plus ethics committee agreed with not having to perform futile care.  Multiple meetings with 1 son [name]  in disagreement.  Best Practice: (right click and "Reselect all SmartList Selections" daily)  Diet:  Tube Feed  Pain/Anxiety/Delirium protocol (if indicated): Yes (RASS goal -1) VAP protocol (if indicated): Yes DVT prophylaxis: LMWH GI prophylaxis: PPI Glucose control:  SSI No Central venous access:  N/A Arterial line:  N/A Foley:  Yes, and it is still needed due to sacral ulcer Mobility:  bed rest  PT consulted: N/A Last date of multidisciplinary goals of care discussion [6/3] Code Status:  DNR Disposition: ICU Family: sons have ongoing discussion with all medical parties involved patient is a DNR with 1 son disagreeing.  Signature:    App cct 34 min   Brett Canales Minor ACNP Acute Care Nurse Practitioner [facility] Please consult Amion 02/23/2021, 10:35 AM   Attending note: I have seen and examined the patient. History, labs and imaging reviewed.  75 year old with hypertension, type 2 diabetes, lung cancer, dementia, failure to thrive with decub ulcer presenting with cardiac arrest with severe anoxic encephalopathy myoclonic seizures Remains on the ventilator with ongoing seizures  Blood pressure (!) 78/51, pulse 75, temperature (!) 92 F (33.3 C), temperature source Oral, resp. rate 18, height 5' 4.02" (1.626 m), weight 54 kg, SpO2 100 %. Gen:      No acute distress, chronically ill-appearing, HEENT:  EOMI, sclera anicteric Neck:     No masses; no thyromegaly, ET tube Lungs:    Clear to auscultation bilaterally; normal respiratory effort CV:  Regular rate and rhythm; no murmurs Abd:      + bowel sounds; soft, non-tender; no palpable masses, no distension Ext:    No edema; adequate peripheral perfusion Skin:      Warm and dry; no rash Neuro: Unresponsive  Labs/Imaging personally reviewed, significant for BUN/creatinine 99/0.98, WBC 12, hemoglobin 7, platelets 409 No new imaging  Assessment/plan: Cardiac arrest with severe anoxic brain  injury, ongoing status myoclonic seizures She is continuing to seize on propofol.  Starting Versed drip to help control seizures No chance of meaningful recovery  Hypoxic respiratory failure Continue full vent support  Goals of care She is currently DNR.  There is disagreement among the sons about goals of care Likely extubate to comfort on Saturday 6/11 See note from yesterday  The patient is critically ill with multiple organ systems failure and requires high complexity decision making for assessment and support, frequent evaluation and titration of therapies, application of advanced monitoring technologies and extensive interpretation of multiple databases.  Critical care time - 15 mins. This represents my time independent of the NPs time taking care of the pt.  Chilton Greathouse MD [place] [facility] 02/23/2021, 1:22 PM

## 2021-02-23 NOTE — Therapy (Signed)
Called to assess pt for desaturation into the mid 80's. On arrival the patient was 90% on 100% up from 30%. Tried to wean but pt desaturated again was maintain and wean as tolerated.

## 2021-02-24 LAB — GLUCOSE, CAPILLARY
Glucose-Capillary: 93 mg/dL (ref 70–99)
Glucose-Capillary: 107 mg/dL — ABNORMAL HIGH (ref 70–99)

## 2021-03-17 NOTE — Progress Notes (Addendum)
PCCM note  Patient son Anna Acosta called the unit several times yelling and screaming that we are not giving her enough water and she is not getting fruits avacodo, bananas that she needs for brain recovery. He wants to stop the antiepileptics as he feels that it is making worse.  I tried to explain to him that she is getting adequate water through free water flushes and tube feeds and that his mother is deteriorating with multiorgan failure but he is incomprehensible and unable to listen and understand.  He is threatening to sue and wants to take his mother home but has not made any arrangements for this or for a transfer to another facility  In the meantime we will continue to support Anna Acosta and make sure that she is not in any pain or discomfort.  I anticipate that she will pass away within the next 24 hours.  Chilton Greathouse MD [place] [facility] See Amion for pager  If no response to pager , please call [phone] until 7pm After 7:00 pm call Elink  [phone] 03-03-21, 10:03 AM

## 2021-03-17 NOTE — Progress Notes (Addendum)
NAME:  Anna Acosta MRN:  [id number] DOB:  21-Apr-1946 LOS: 17 ADMISSION DATE:  01/22/2021 CONSULTATION DATE:  01/26/2021 REFERRING MD:  Myrtis Ser CHIEF COMPLAINT:  Cardiac arrest   History of Present Illness:  75 year old chronically unwell female with PMHx significant for HTN, T2DM, lung cancer (s/p resection), dementia and FTT who presented to [facility] ED 5/24 via EMS s/p cardiac arrest. Patient's son was reportedly caring for her sacral decubitus ulcer when he turned her back over and realized she was not breathing/pulseless. Patient's son immediately started CPR. Initial rhythm on first responder arrival was asystole with completion of CPR x 2 rounds (8 total minutes) and Epi x 1 with ROSC. King airway was placed in the field, exchanged to ETT. Low-dose Levophed was initiated.  On arrival to ED, labs notable for WBC 15K, H&H 8.0/28.9, Plt 515. Na 130, K 4.4, CO2 17, BUN 6, Cr 1.15. Trop 19, LA 7.9. ABG 7.370/54.8/546/27.4. CXR demonstrating masslike opacity in L lung apex. CT Head demonstrating mixed density R subdural collection (acute/chronic) with mild mass effect, slight < 23mm R to L midline shift.  PCCM consulted for admission.  Pertinent Medical History:  HTN, T2DM, dementia, lung CA (s/p resection), FTT  Significant Hospital Events: Including procedures, antibiotic start and stop dates in addition to other pertinent events   5/24 - BIB EMS s/p cardiac arrest; CPR x 2 rounds (8 total minutes) and Epi x 1 with ROSC CT Head with mixed acute/chronic subdural collection with mass effect/slight midline shift. Elevated LA. On low dose pressors 5/29 off pressors. 5/30 propofol resumed for clinical seizures/lip twitching. Neuro signed off citing minimal to no chance for a neurologic recovery.  6/2 Myoclonic seizures ongoing despite maximal therapy including propofol. 6/3 Ethics consult for medical futility.  6/5 Son [name] is verbally aggressive with staff, attempting to administer unapproved  substances and barred from entering the hospital 6/7 neurology consulted.  Agree with assessment that she has minimal to no chance for neurologic recovery 02/22/2021 ethics committee along with neurology critical care Agree this is futile care.  Plan for one-way extubation in the future. 6/9 continues to be in status myoclonic state.  On Versed and propofol drips.  Interim History / Subjective:   More hypotensive overnight.  Continues to have intermittent seizures.  Mental status is unchanged Has become anuric  Objective:  Blood pressure (!) 63/44, pulse 65, temperature (!) 94.3 F (34.6 C), temperature source Axillary, resp. rate 18, height 5' 4.02" (1.626 m), weight 56.4 kg, SpO2 92 %.    Vent Mode: PRVC FiO2 (%):  [40 %-50 %] 40 % Set Rate:  [18 bmp] 18 bmp Vt Set:  [430 mL] 430 mL PEEP:  [5 cmH20] 5 cmH20 Plateau Pressure:  [20 cmH20-25 cmH20] 25 cmH20   Intake/Output Summary (Last 24 hours) at 2021/03/16 0743 Last data filed at 03-16-21 0603 Gross per 24 hour  Intake 2367.86 ml  Output 146 ml  Net 2221.86 ml   Filed Weights   02/22/21 0455 02/23/21 0456 Mar 16, 2021 0447  Weight: 53.5 kg 54 kg 56.4 kg   Physical Examination:  Gen:      No acute distress, frail, elderly HEENT:  EOMI, sclera anicteric Neck:     No masses; no thyromegaly, ETT Lungs:    Clear to auscultation bilaterally; normal respiratory effort CV:         Regular rate and rhythm; no murmurs Abd:      + bowel sounds; soft, non-tender; no palpable masses, no distension Ext:  No edema; adequate peripheral perfusion Skin:      Warm and dry; no rash Neuro: Unresponsive    Labs/imaging that I have personally reviewed: (right click and "Reselect all SmartList Selections" daily)   LTM EEG 5/26 worsening myoclonic seizures now consistent with myoclonic status epilepticus  MRI brain 5/29 Symmetric abnormal diffusion and T2/FLAIR signal abnormality involving the basal ganglia, thalami, perirolandic cortices,  and cerebellum. Findings consistent with anoxic brain injury Subacute subdural hematoma overlying the right cerebral convexity measuring up to 1.3 cm with associated trace right-to-left shift. Advanced temporal lobe predominant cerebral atrophy   No new labs or imaging  Resolved Hospital Problem List:     Assessment & Plan:   [name] who experienced cardiopulmonary arrest 5/24 while son was delivering care, ROSC achieved after 8 minutes of CPR. She was intubated and started on pressor support. CT head showed acute on chronic right sided subdural hematoma and subsequently found to be having a myoclonic seizure.   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 LVEF intact, b ut diastolic dysfunction noted on echo. No current interventions needed   HTN Labetalol and ACE inhibitor's have stopped 02/23/2021 due to hypotension   hypoxic respiratory failure  Continue vent support, failing SBT's due to poor mental status  Severe sepsis, present on admission Finished Zosyn.   Has been intermittently febrile but no evidence of active infection with low PCT and normal WBC count Fevers likely due to neurologic source Observe off antibiotics  AKI, now anuric due to hypotension Hypernatremia Continue free water  Trend CBC  LUE swelling, unilateral Supportive care  Sacral decubitus ulcer Stage IV, present on admission Continue wound care  History of diabetes CBG  History of lung cancer s/p resection [place]  Severe malnutrition secondary to chronic illness Continue nutrition  Goals of care: Medical team agree that aggressive care is not medically effective and will only prolong pain and suffering.  No trach, PEG  Sons are in disagreement with one  advocating for comfort measures while the younger son is insisting on aggressive care.  The younger son [name] has been calling the unit several times a day yelling and screaming.  It has been hard to reach through to him to make him understand the situation. I have called the elder son Sidonie Dickens several times over the past 24 hrs without reply and left a voice message requesting call back.  With worsening shock, renal failure she is declining.  Possible one-way extubation when the elder son has a chance to arrive from [facility]: (right click and "Reselect all SmartList Selections" daily)  Diet:  Tube Feed  Pain/Anxiety/Delirium protocol (if indicated): Yes (RASS goal -1) VAP protocol (if indicated): Yes DVT prophylaxis: LMWH GI prophylaxis: PPI Glucose control:  SSI No Central venous access:  N/A Arterial line:  N/A Foley:  Yes, and it is still needed due to sacral ulcer Mobility:  bed rest  PT consulted: N/A Last date of multidisciplinary goals of care discussion [6/3] Code Status:  DNR Disposition: ICU Family: sons have ongoing discussion with all medical parties involved patient is a DNR with 1 son disagreeing.  Signature:    The patient is critically ill with multiple organ system failure and requires high complexity decision making for assessment and support, frequent evaluation and titration of therapies, advanced  monitoring, review of radiographic studies and interpretation of complex data.   Critical Care Time devoted to patient care services, exclusive of separately billable procedures, described in this note is 45 minutes.   Chilton Greathouse MD [facility] See Amion for pager  If no response to pager , please call [phone] until 7pm After 7:00 pm call Elink  [phone] 02-28-2021, 7:48 AM

## 2021-03-17 NOTE — Progress Notes (Signed)
Contacted the pt's son Anna Acosta this afternoon. The Clinical research associate of this note shared the passing of this pt with her son via telephone. It was explained to Anna Acosta that his mother passed while still receiving ventilatory support, her continuous medications, and her tube feedings. Anna Acosta verbalized his understanding of her passing. It was also noted to Anna Acosta that this pt has been clean up and prepped so that he may sit by her side when he arrives and spend as much time as needed with her. Anna Acosta also understands that he will have to break the news of his mother's passing to his brother, Anna Acosta, as staff have been unable to communicate with him during the earlier hours of this shift. The chaplin will be notified upon his request when he arrives for any spiritual concerns. Mannam, MD, has been notified and made aware. [place] has been notified as well.

## 2021-03-17 NOTE — Death Summary Note (Signed)
  DEATH SUMMARY   Patient Details  Name: Armiyah Capron MRN: [id number] DOB: 01/23/1946  Admission/Discharge Information   Admit Date:  02/14/21  Date of Death: Date of Death: 2021-03-03  Time of Death: Time of Death: 1157-12-31  Length of Stay: January 09, 2023  Referring Physician: Default, Provider, MD   Reason(s) for Hospitalization  Cardiac arrest  Diagnoses  Preliminary cause of death:  Severe anoxic brain injury secondary to cardiac arrest Encephalopathy secondary to anoxic brain injury Myoclonic seizures Cardiac arrest Severe sepsis present on admission Acute kidney injury Hyponatremia Sacral decub stage IV present on admission Severe malnutrition secondary to chronic illness  Secondary Diagnoses (including complications and co-morbidities):  Active Problems:   Cardiac arrest (HCC)   Pressure injury of skin   Hypotension   Protein-calorie malnutrition, severe   Anoxic encephalopathy (HCC)   Status epilepticus [facility])   Brief Hospital Course (including significant findings, care, treatment, and services provided and events leading to death)  Ednamae Schiano is a 75 y.o. year old female with PMHx significant for HTN, T2DM, lung cancer (s/p resection), dementia and FTT who presented to [facility] ED Feb 15, 2023 via EMS s/p cardiac arrest.  02-15-2023 - BIB EMS s/p cardiac arrest; CPR x 2 rounds (8 total minutes) and Epi x 1 with ROSC CT Head with mixed acute/chronic subdural collection with mass effect/slight midline shift. Elevated LA. On low dose pressors 5/29 off pressors. 5/30 propofol resumed for clinical seizures/lip twitching. Neuro signed off citing minimal to no chance for a neurologic recovery. 6/2 Myoclonic seizures ongoing despite maximal therapy including propofol. 6/3 Ethics consult for medical futility. 6/5 Son [name] is verbally aggressive with staff, attempting to administer unapproved substances and barred from entering the hospital 6/7 neurology re consulted.  Agree with assessment  that she has minimal to no chance for neurologic recovery 02/22/2021 ethics committee along with neurology critical care agree this trach, peg is not medically effective.  Plan for one-way extubation in the future. 6/9 continues to be in status myoclonic state.  On Versed and propofol drips. Mar 04, 2023 Cardiac arrest  Medical team agreed that aggressive care is not medically effective and will only prolong pain and suffering.  No trach, PEG will be offered.  Ethics was consulted who agreed with assessment  Sons are in disagreement with Shaun advocating for comfort measures while the younger son is insisting on aggressive care.  The younger son [name] has been verbally aggressive with the staff and was barred from coming to the bedside.  He kept calling the unit several times a day yelling and screaming.  It had been hard to reach through to him to make him understand the situation.  The plan was to consider one-way extubation to comfort care once Shaun had a chance to visit from [place]. But before this could occur patient had worsening shock, anuric renal failure.  She went into another cardiac arrest on 03-04-23.  CPR was not done as she was DNR.  She passed away and Shaun was informed over the telephone.  Chilton Greathouse MD [facility] [place] 2021/03/03, 3:48 PM

## 2021-03-17 NOTE — Progress Notes (Signed)
Chaplain rec'd call from [facility] that pt's son Anna Acosta and wife were at entrance to see the deceased. Chaplain escorted family to the morgue for viewing and prayer.   Chaplain gave Patient placement card to son and rec'd his info for PP.   [address] [place], [place]   [address]  [phone]  Shawn relayed that his brother Anna Acosta was unreachable this afternoon and wanted to know if the hospital had informed him of his mother's death.  The [facility] said he would imake an inquiry. Chaplain escorted family to exit.    Rev. Lynnell Chad Pager [phone]

## 2021-03-17 DEATH — deceased

## 2022-05-11 IMAGING — CT CT HEAD W/O CM
4 series · 15 of 47 positions shown, 17 images · non-contrast
Comparison: [DATE] p.m.

CLINICAL DATA: Subdural hematoma follow-up examination

EXAM:
CT HEAD WITHOUT CONTRAST
TECHNIQUE: Contiguous axial images were obtained from the base of the skull
through the vertex without intravenous contrast.

[Series 3: head wo · axial · 0.39mm/px · z∈[+1036,+1150]mm · 7 of 31 slices shown, 9 images]
[im 4/31  brain]
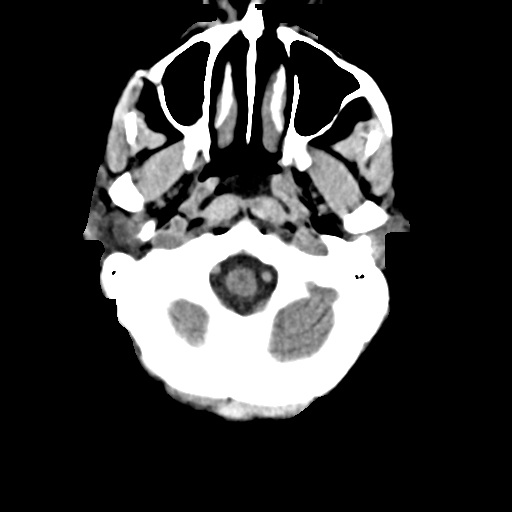
[im 4/31  bone]
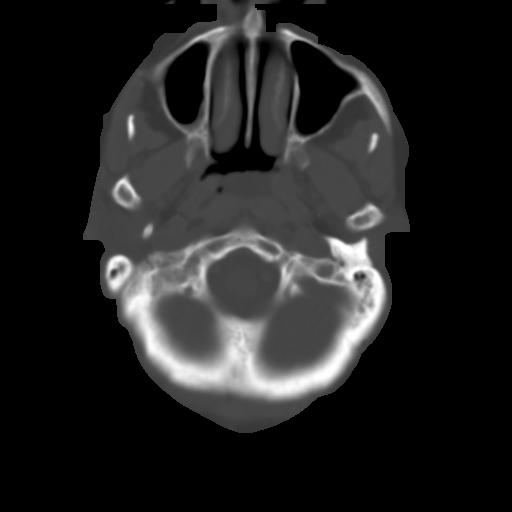
[im 8/31  brain]
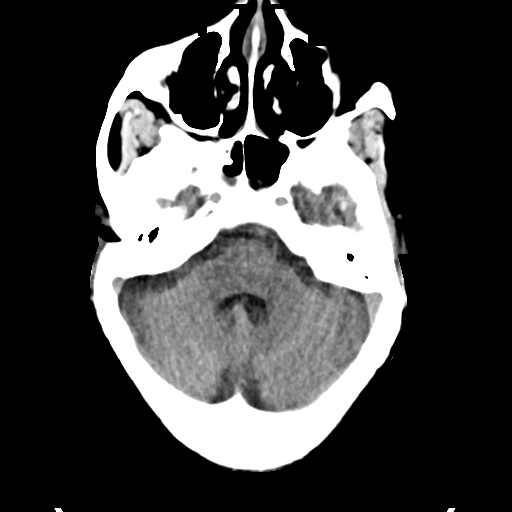
[im 12/31  brain]
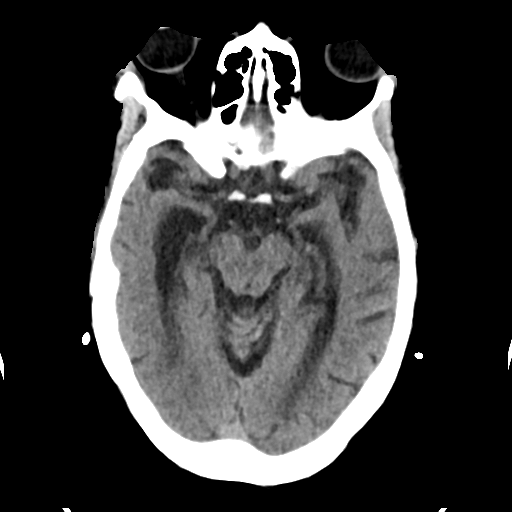
[im 16/31  brain]
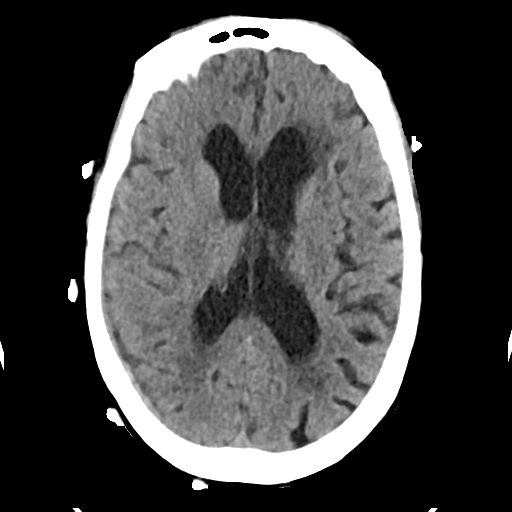
[im 19/31  brain]
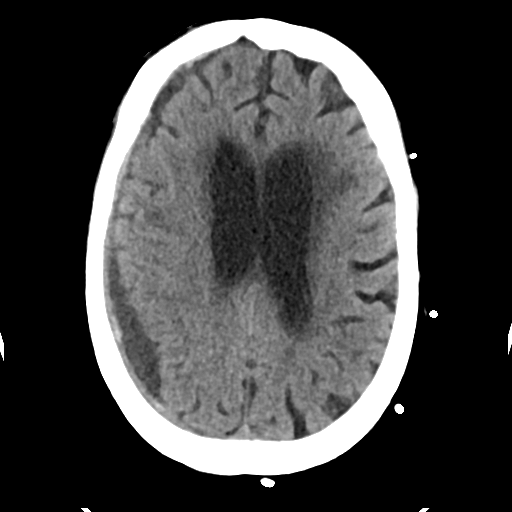
[im 19/31  bone]
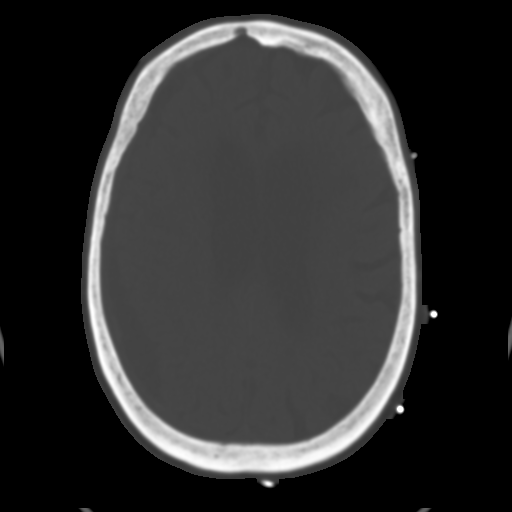
[im 23/31  brain]
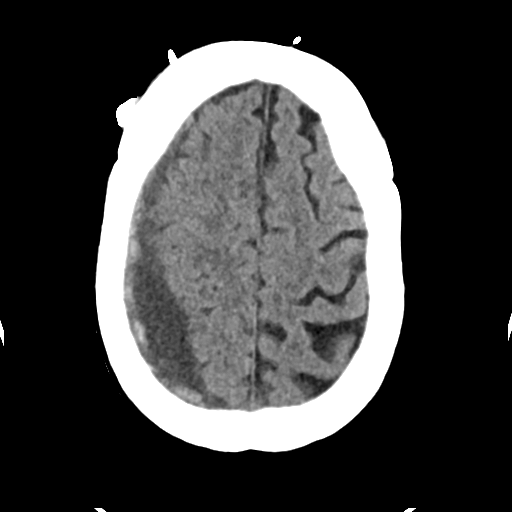
[im 27/31  brain]
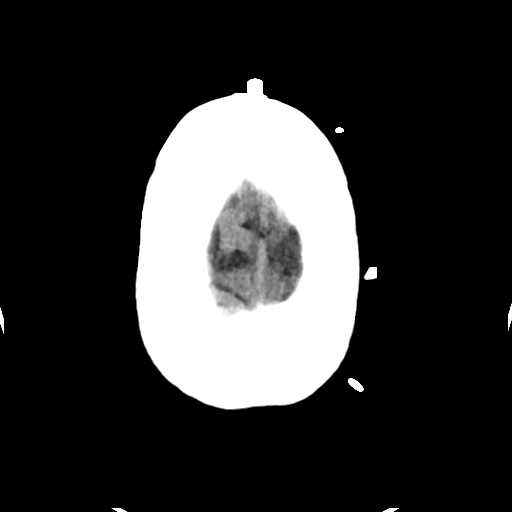

[Series 4: head bone · axial · 0.39mm/px · z∈[+1034,+1050]mm · 2 of 78 slices shown]
[im 8/78  bone]
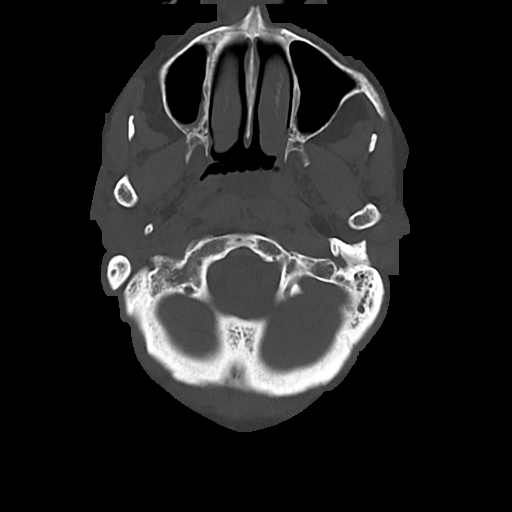
[im 16/78  bone]
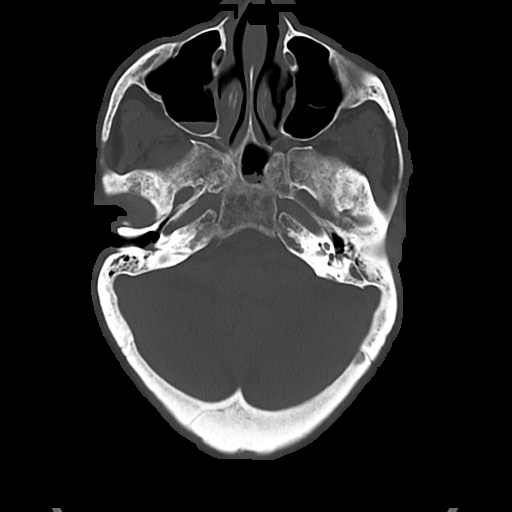

[Series 5: cor soft · coronal · 0.31mm/px · 3 of 68 slices shown]
[im 23/68  brain]
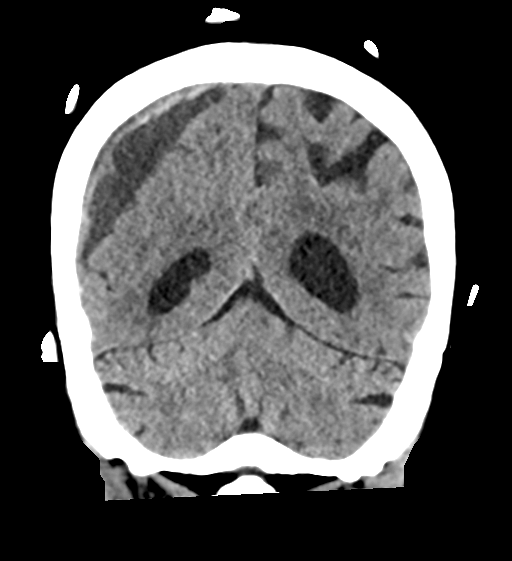
[im 30/68  brain]
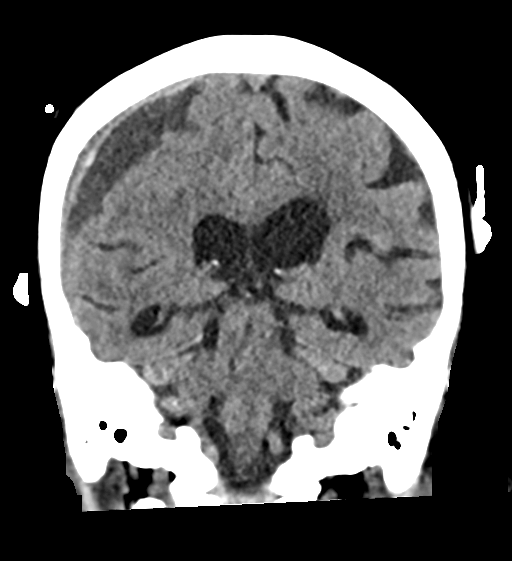
[im 38/68  brain]
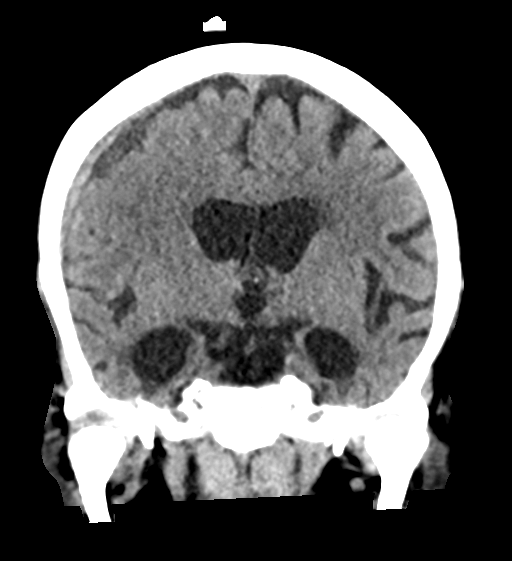

[Series 6: sag soft · sagittal · 0.34mm/px · 3 of 52 slices shown]
[im 18/52  brain]
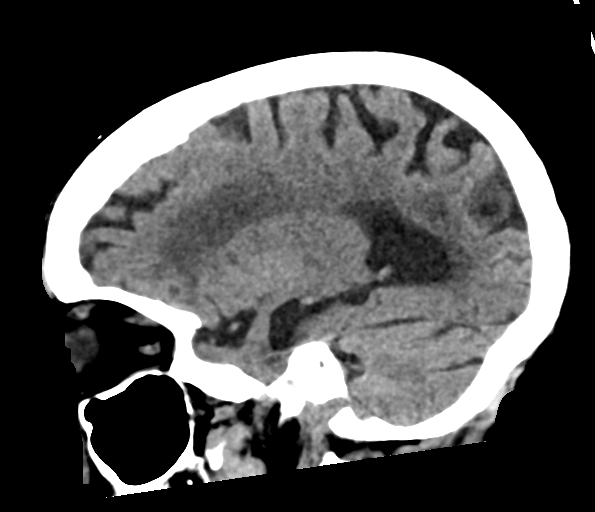
[im 26/52  brain]
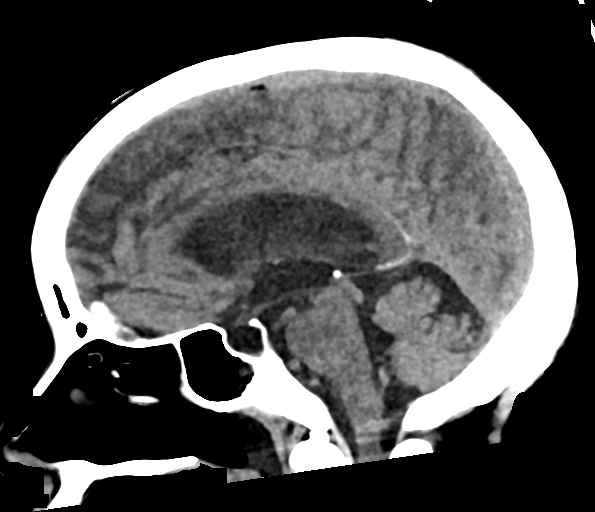
[im 35/52  brain]
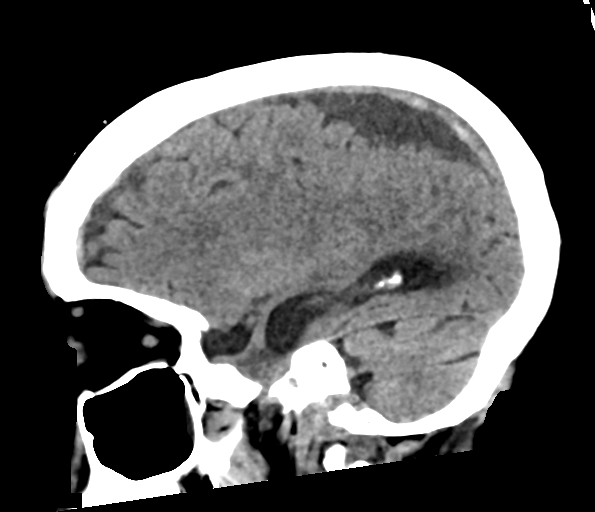

[15 of 47 positions shown; findings below may reference images not displayed]

FINDINGS: Brain: Subdural hematoma overlying the right cerebral convexity is
again identified and appears stable since prior examination
measuring 21 mm in thickness when measured in similar fashion within
the right parietal region. There is stable mass effect upon the
right cerebral hemisphere with effacement of the sulci and 2 mm
right to left midline shift. There are dural calcifications noted
overlying the right cerebral convexity adjacent to the subdural
hematoma suggesting a subacute to chronic etiology.

Moderate parenchymal volume loss is again identified, commensurate
with the patient's age. Mild ventriculomegaly likely reflects
central atrophy and resultant ex vacuo dilation of the lateral
ventricles, particularly within the temporal horns. There are
moderate periventricular white matter changes present likely
reflecting the sequela of small vessel ischemia.

No acute infarct. Remote infarct within the right cerebellar
hemisphere again noted. Cerebellum otherwise unremarkable.

Vascular: No asymmetric hyperdense vasculature at the skull base.

Skull: The calvarium is intact.

Sinuses/Orbits: Layering fluid is seen within the right maxillary
and sphenoid sinuses and there is opacification of several ethmoid
air cells bilaterally, similar to that noted on prior examination.
The orbits are unremarkable. The

Other: The mastoid air cells demonstrate partial fluid opacification
bilaterally. Middle ear cavities are clear.
IMPRESSION: Stable subdural hematoma with associated dural calcifications
suggesting a subacute to chronic etiology. Stable mild mass effect
upon the right cerebral hemisphere and minimal right to left midline
shift.

Advanced senescent change with asymmetric atrophy of the a anterior
temporal lobes bilaterally. Moderate periventricular white matter
changes most in keeping with small vessel ischemic change.

Moderate paranasal sinus disease with air-fluid levels possibly
reflecting changes of acute sinusitis.

Bilateral mastoid effusions.

## 2022-05-11 IMAGING — CT CT HEAD W/O CM
4 series · 15 of 47 positions shown, 17 images · non-contrast
Comparison: None.

CLINICAL DATA: Altered mental status.  Post cardiac arrest.

EXAM:
CT HEAD WITHOUT CONTRAST
TECHNIQUE: Contiguous axial images were obtained from the base of the skull
through the vertex without intravenous contrast.

[Series 3: head bone · axial · 0.41mm/px · z∈[-158,-122]mm · 3 of 77 slices shown]
[im 8/77  bone]
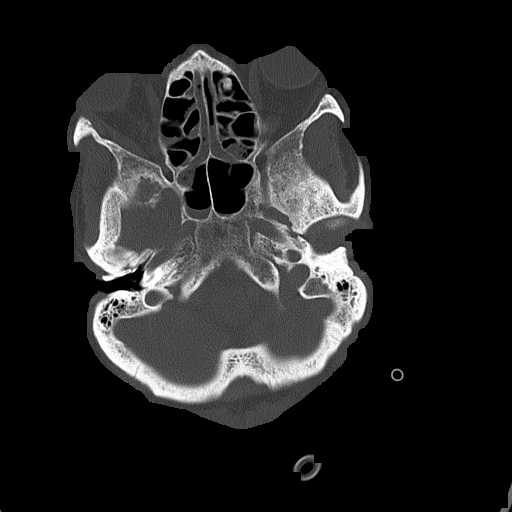
[im 15/77  bone]
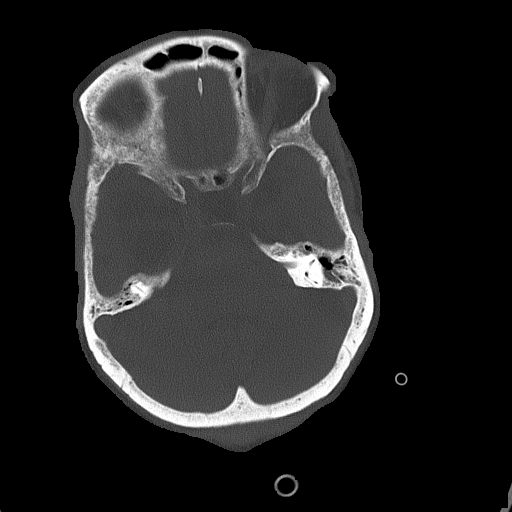
[im 26/77  bone]
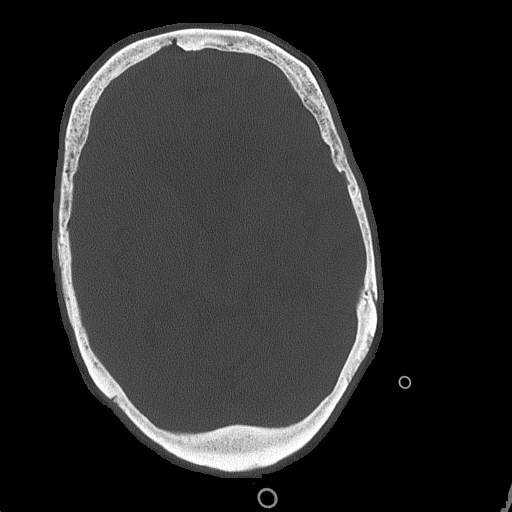

[Series 4: head without · axial · non-contrast · 0.41mm/px · z∈[-152,-52]mm · 6 of 30 slices shown, 8 images]
[im 5/30  brain]
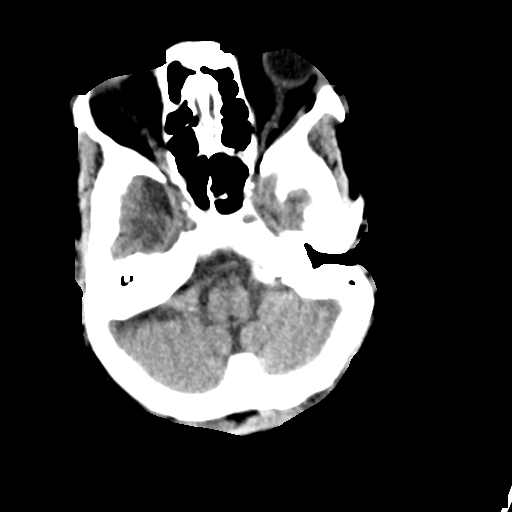
[im 5/30  bone]
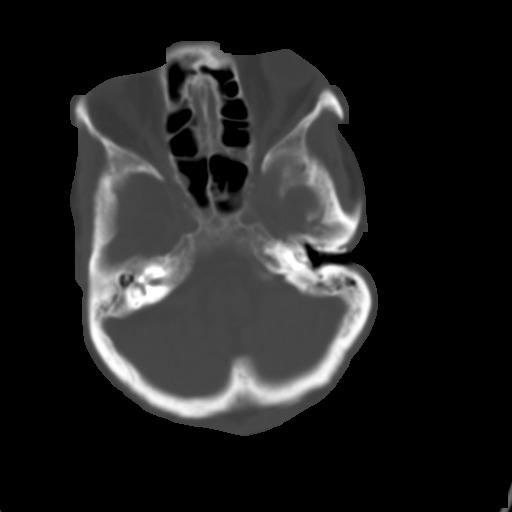
[im 9/30  brain]
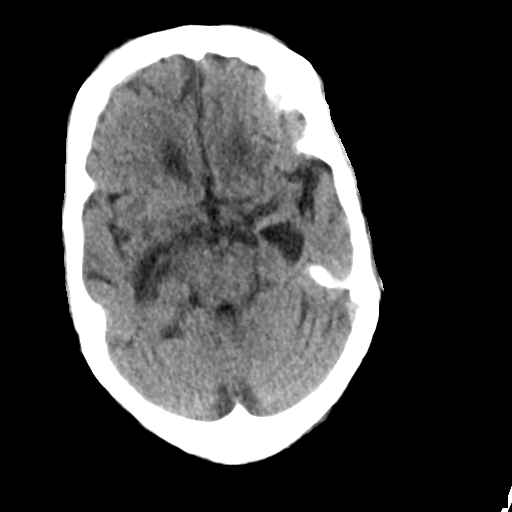
[im 13/30  brain]
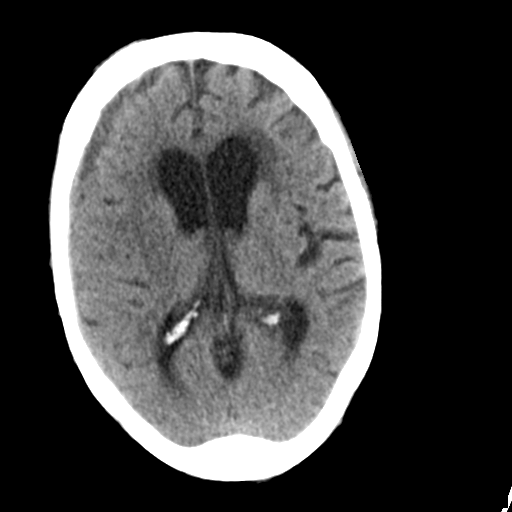
[im 17/30  brain]
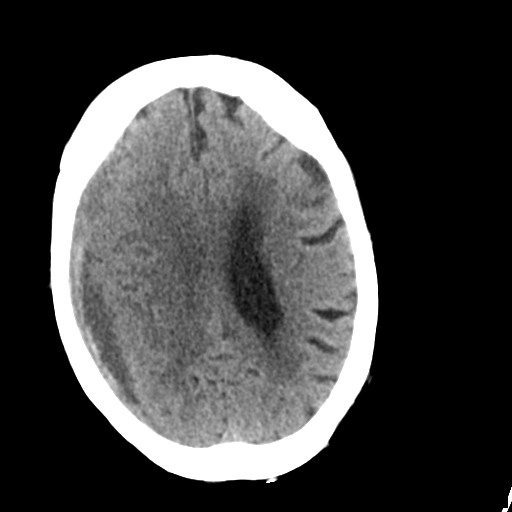
[im 21/30  brain]
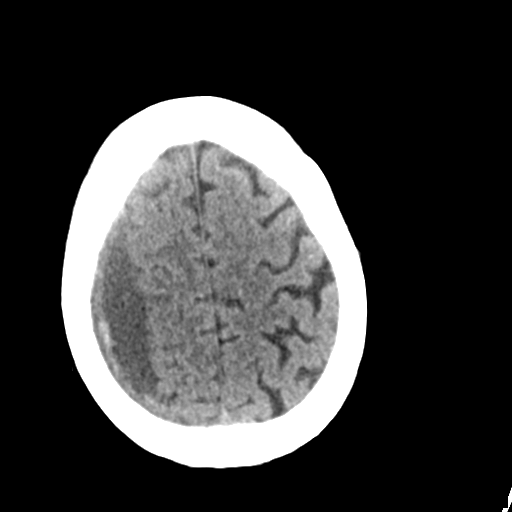
[im 21/30  bone]
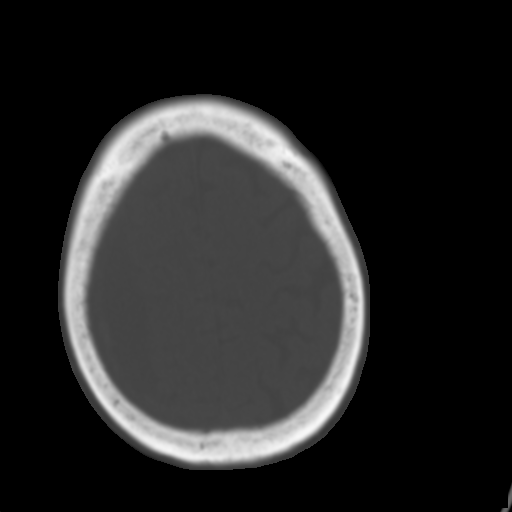
[im 25/30  brain]
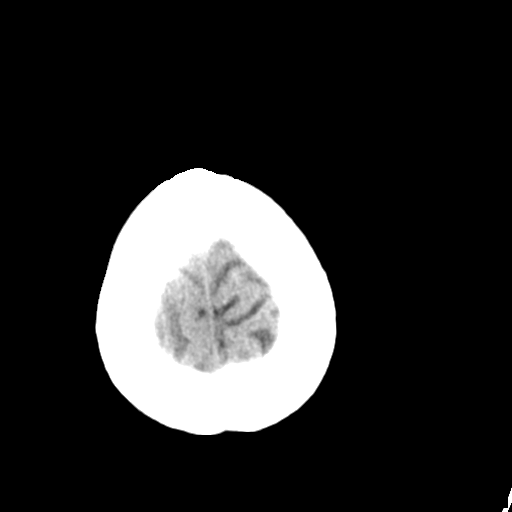

[Series 5: head without cor · coronal · non-contrast · 0.29mm/px · 3 of 64 slices shown]
[im 22/64  brain]
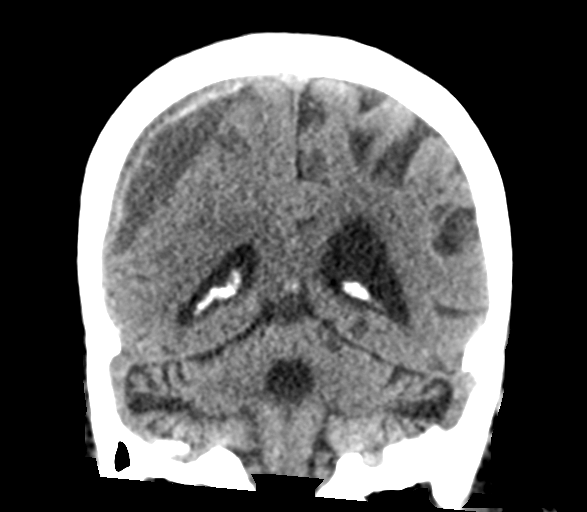
[im 29/64  brain]
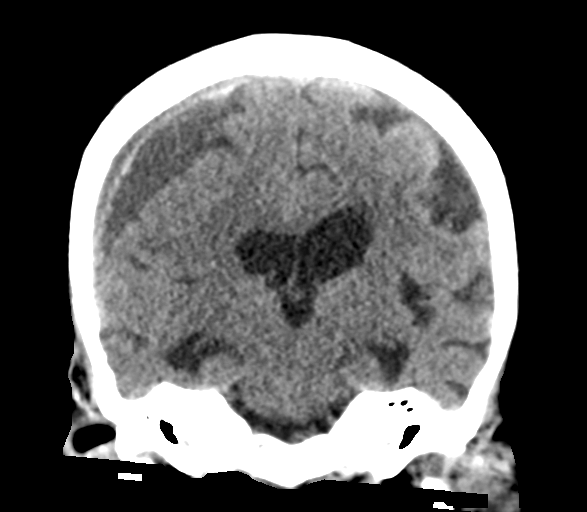
[im 36/64  brain]
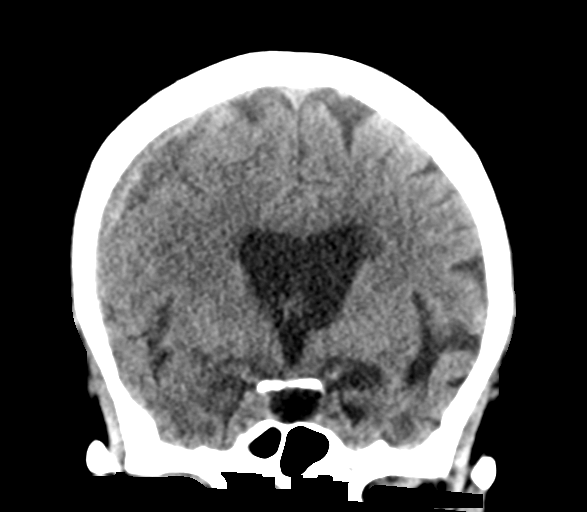

[Series 6: head without sag · sagittal · non-contrast · 0.28mm/px · 3 of 48 slices shown]
[im 16/48  brain]
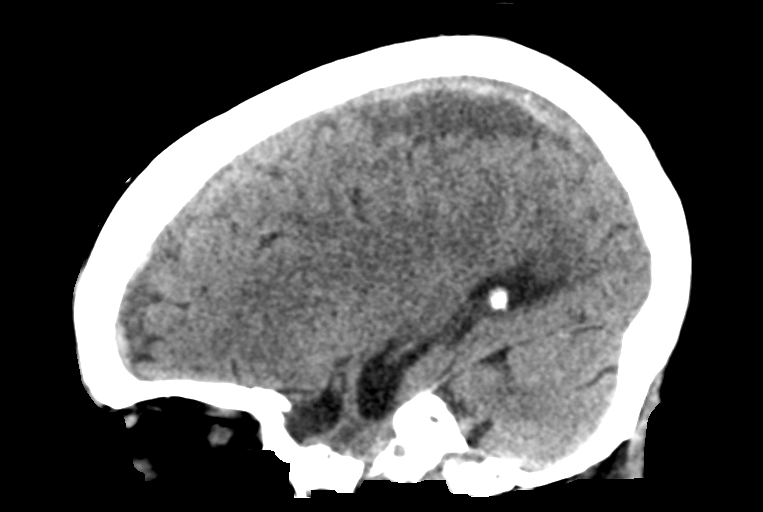
[im 24/48  brain]
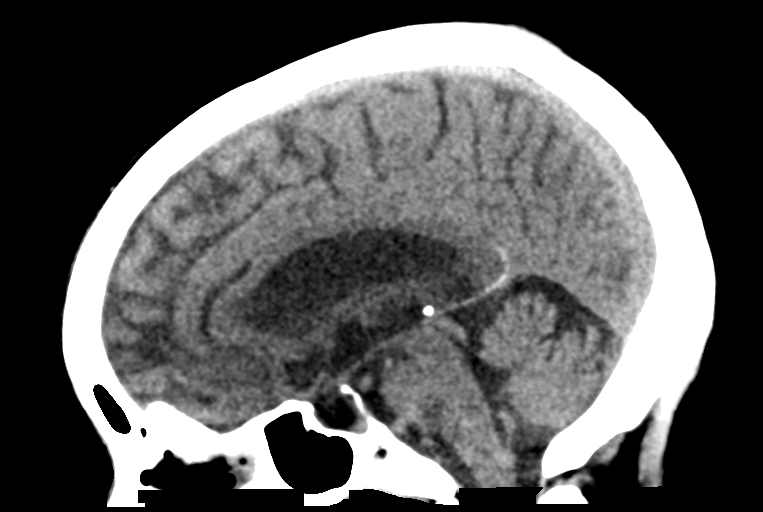
[im 32/48  brain]
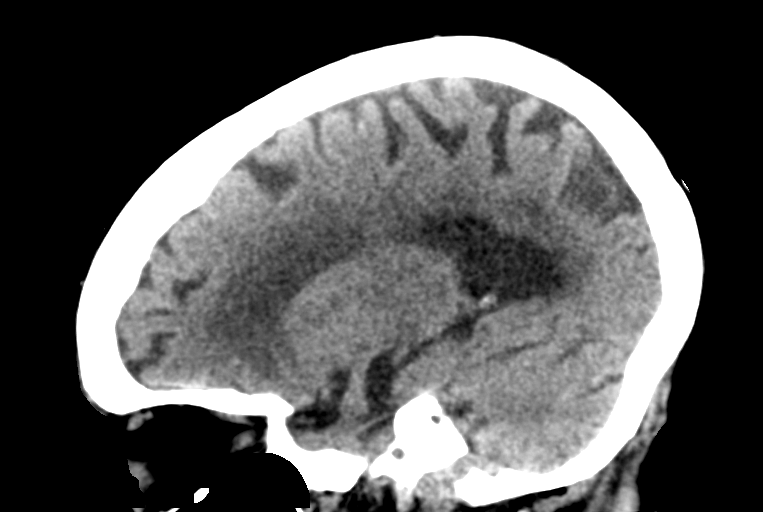

[15 of 47 positions shown; findings below may reference images not displayed]

FINDINGS: Brain: Mixed density right subdural collection with both acute and
chronic blood products. This measures up to 20 mm in thickness
overlying the parietal lobe, series 4, image 20. There is mild mass
effect on the subjacent right cerebral hemisphere. Less than 2 mm
right to left midline shift. Generalized atrophy, with a slight
temporal lobe predominant. Ventriculomegaly may be due to central
atrophy. Periventricular white matter hypodensity typical of chronic
small vessel ischemia. Small remote lacunar infarct in the left
basal ganglia. No evidence of acute ischemia. Partially empty sella.
The basilar cisterns are patent.

Vascular: No hyperdense vessel.

Skull: No fracture or focal lesion.

Sinuses/Orbits: Mucosal thickening throughout the ethmoid air cells
with small fluid levels in the sphenoid sinuses. Opacification of
right greater than left mastoid air cells. Mild proptosis. No acute
orbital findings.

Other: None.
IMPRESSION: 1. Mixed density right subdural collection with both acute and
chronic blood products. This measures up to 20 mm in thickness
overlying the parietal lobe. There is mild mass effect on the
subjacent right cerebral hemisphere. Less than 2 mm right to left
midline shift.
2. Generalized atrophy and chronic small vessel ischemia.

Critical Value/emergent results were called by telephone at the time
of interpretation on 02/07/2021 at [DATE] to provider WARRAICH GCC ,
who verbally acknowledged these results.
